# Patient Record
Sex: Female | Born: 1970 | Race: White | Hispanic: No | Marital: Married | Smoking: Current every day smoker
Health system: Southern US, Community
[De-identification: ages and names within clinical notes are randomized; demographics above are authoritative.]

## PROBLEM LIST (undated history)

## (undated) DIAGNOSIS — F32A Depression, unspecified: Secondary | ICD-10-CM

## (undated) DIAGNOSIS — K219 Gastro-esophageal reflux disease without esophagitis: Secondary | ICD-10-CM

## (undated) DIAGNOSIS — F329 Major depressive disorder, single episode, unspecified: Secondary | ICD-10-CM

## (undated) DIAGNOSIS — E785 Hyperlipidemia, unspecified: Secondary | ICD-10-CM

## (undated) HISTORY — PX: CHOLECYSTECTOMY: SHX55

---

## 2003-12-26 ENCOUNTER — Ambulatory Visit: Payer: Self-pay | Admitting: General Surgery

## 2005-02-02 ENCOUNTER — Ambulatory Visit: Payer: Self-pay | Admitting: Pain Medicine

## 2009-12-13 ENCOUNTER — Ambulatory Visit: Payer: Self-pay | Admitting: Internal Medicine

## 2010-10-06 ENCOUNTER — Ambulatory Visit: Payer: Self-pay | Admitting: Internal Medicine

## 2010-10-30 ENCOUNTER — Ambulatory Visit: Payer: Self-pay | Admitting: Family Medicine

## 2011-08-28 ENCOUNTER — Ambulatory Visit: Payer: Self-pay | Admitting: Internal Medicine

## 2011-08-29 ENCOUNTER — Emergency Department: Payer: Self-pay | Admitting: Emergency Medicine

## 2012-08-27 ENCOUNTER — Ambulatory Visit: Payer: Self-pay | Admitting: Family Medicine

## 2012-08-29 ENCOUNTER — Ambulatory Visit: Payer: Self-pay

## 2013-01-29 ENCOUNTER — Ambulatory Visit: Payer: Self-pay | Admitting: Family Medicine

## 2013-03-13 ENCOUNTER — Ambulatory Visit: Payer: Self-pay | Admitting: Physician Assistant

## 2013-03-19 LAB — RAPID INFLUENZA A&B ANTIGENS

## 2013-04-16 ENCOUNTER — Ambulatory Visit: Payer: Self-pay | Admitting: Physician Assistant

## 2013-12-02 ENCOUNTER — Ambulatory Visit: Payer: Self-pay | Admitting: Family Medicine

## 2014-09-01 ENCOUNTER — Encounter: Payer: Self-pay | Admitting: Emergency Medicine

## 2014-09-01 ENCOUNTER — Ambulatory Visit
Admission: EM | Admit: 2014-09-01 | Discharge: 2014-09-01 | Disposition: A | Discharge status: Home / Self Care | Payer: Managed Care, Other (non HMO) | Attending: Family Medicine | Admitting: Family Medicine

## 2014-09-01 DIAGNOSIS — F17210 Nicotine dependence, cigarettes, uncomplicated: Secondary | ICD-10-CM | POA: Insufficient documentation

## 2014-09-01 DIAGNOSIS — B9689 Other specified bacterial agents as the cause of diseases classified elsewhere: Secondary | ICD-10-CM

## 2014-09-01 DIAGNOSIS — N760 Acute vaginitis: Secondary | ICD-10-CM | POA: Diagnosis not present

## 2014-09-01 DIAGNOSIS — N898 Other specified noninflammatory disorders of vagina: Secondary | ICD-10-CM | POA: Diagnosis present

## 2014-09-01 DIAGNOSIS — A499 Bacterial infection, unspecified: Secondary | ICD-10-CM

## 2014-09-01 DIAGNOSIS — E785 Hyperlipidemia, unspecified: Secondary | ICD-10-CM | POA: Insufficient documentation

## 2014-09-01 HISTORY — DX: Hyperlipidemia, unspecified: E78.5

## 2014-09-01 LAB — WET PREP, GENITAL
Trich, Wet Prep: NONE SEEN
Yeast Wet Prep HPF POC: NONE SEEN

## 2014-09-01 MED ORDER — METRONIDAZOLE 0.75 % VA GEL: 1.0000 | Freq: Every day | VAGINAL | Status: DC

## 2014-09-01 NOTE — Discharge Instructions (Signed)
Bacterial Vaginosis Bacterial vaginosis is a vaginal infection that occurs when the normal balance of bacteria in the vagina is disrupted. It results from an overgrowth of certain bacteria. This is the most common vaginal infection in women of childbearing age. Treatment is important to prevent complications, especially in pregnant women, as it can cause a premature delivery. CAUSES  Bacterial vaginosis is caused by an increase in harmful bacteria that are normally present in smaller amounts in the vagina. Several different kinds of bacteria can cause bacterial vaginosis. However, the reason that the condition develops is not fully understood. RISK FACTORS Certain activities or behaviors can put you at an increased risk of developing bacterial vaginosis, including:  Having a new sex partner or multiple sex partners.  Douching.  Using an intrauterine device (IUD) for contraception. Women do not get bacterial vaginosis from toilet seats, bedding, swimming pools, or contact with objects around them. SIGNS AND SYMPTOMS  Some women with bacterial vaginosis have no signs or symptoms. Common symptoms include:  Grey vaginal discharge.  A fishlike odor with discharge, especially after sexual intercourse.  Itching or burning of the vagina and vulva.  Burning or pain with urination. DIAGNOSIS  Your health care provider will take a medical history and examine the vagina for signs of bacterial vaginosis. A sample of vaginal fluid may be taken. Your health care provider will look at this sample under a microscope to check for bacteria and abnormal cells. A vaginal pH test may also be done.  TREATMENT  Bacterial vaginosis may be treated with antibiotic medicines. These may be given in the form of a pill or a vaginal cream. A second round of antibiotics may be prescribed if the condition comes back after treatment.  HOME CARE INSTRUCTIONS   Only take over-the-counter or prescription medicines as  directed by your health care provider.  If antibiotic medicine was prescribed, take it as directed. Make sure you finish it even if you start to feel better.  Do not have sex until treatment is completed.  Tell all sexual partners that you have a vaginal infection. They should see their health care provider and be treated if they have problems, such as a mild rash or itching.  Practice safe sex by using condoms and only having one sex partner. SEEK MEDICAL CARE IF:   Your symptoms are not improving after 3 days of treatment.  You have increased discharge or pain.  You have a fever. MAKE SURE YOU:   Understand these instructions.  Will watch your condition.  Will get help right away if you are not doing well or get worse. FOR MORE INFORMATION  Centers for Disease Control and Prevention, Division of STD Prevention: www.cdc.gov/std American Sexual Health Association (ASHA): www.ashastd.org  Document Released: 02/23/2005 Document Revised: 12/14/2012 Document Reviewed: 10/05/2012 ExitCare Patient Information 2015 [employer]. This information is not intended to replace advice given to you by your health care provider. Make sure you discuss any questions you have with your health care provider.  

## 2014-09-01 NOTE — ED Provider Notes (Signed)
CSN: [id number]     Arrival date & time 09/01/14  1034 History   First MD Initiated Contact with Patient 09/01/14 1059     Chief Complaint  Patient presents with  . Vaginal Discharge   (Consider location/radiation/quality/duration/timing/severity/associated sxs/prior Treatment) HPI Comments: Caucasian female with white discharge since this am.  Took po metronidazole 4 months ago for bacterial vaginosis and symptoms cleared but have reoccurred.  Slight itching/discomfort.  Wears synthetic underwear and currently on menstrual cycle wearing pads/tampons.  Patient is a 44 y.o. female presenting with vaginal discharge. The history is provided by the patient.  Vaginal Discharge Quality:  White and thin Severity:  Moderate Onset quality:  Sudden Timing:  Constant Progression:  Worsening Chronicity:  Recurrent Context: after intercourse   Context: not after urination, not at rest, not during bowel movement, not during intercourse, not during pregnancy, not during urination, not genital trauma, not recent antibiotic use and not spontaneously   Relieved by:  None tried Worsened by:  Urination Ineffective treatments:  None tried Associated symptoms: vaginal itching   Associated symptoms: no abdominal pain, no dyspareunia, no dysuria, no fever, no genital lesions, no nausea, no rash, no urinary frequency, no urinary hesitancy, no urinary incontinence and no vomiting   Risk factors: no endometriosis, no foreign body, no gynecological surgery, no immunosuppression, no new sexual partner, no PID, no prior miscarriage, no STI, no STI exposure, no terminated pregnancy and no unprotected sex     Past Medical History  Diagnosis Date  . Hyperlipidemia    Past Surgical History  Procedure Laterality Date  . Cholecystectomy     No family history on file. History  Substance Use Topics  . Smoking status: Current Every Day Smoker -- 1.00 packs/day for 2 years    Types: Cigarettes  . Smokeless tobacco:  Not on file  . Alcohol Use: No   OB History    No data available     Review of Systems  Constitutional: Negative for fever, chills, diaphoresis, activity change, appetite change and fatigue.  HENT: Negative for facial swelling, mouth sores and trouble swallowing.   Eyes: Negative for photophobia, pain, discharge, redness, itching and visual disturbance.  Respiratory: Negative for cough and wheezing.   Cardiovascular: Negative for chest pain, palpitations and leg swelling.  Gastrointestinal: Negative for nausea, vomiting, abdominal pain, diarrhea, constipation, blood in stool, abdominal distention, anal bleeding and rectal pain.  Endocrine: Negative for cold intolerance and heat intolerance.  Genitourinary: Positive for vaginal bleeding, vaginal discharge and vaginal pain. Negative for bladder incontinence, dysuria, hesitancy, urgency, frequency, hematuria, flank pain, decreased urine volume, enuresis, genital sores, menstrual problem, pelvic pain and dyspareunia.  Musculoskeletal: Negative for myalgias, back pain, joint swelling, arthralgias, gait problem, neck pain and neck stiffness.  Skin: Negative for color change, pallor, rash and wound.  Allergic/Immunologic: Negative for environmental allergies, food allergies and immunocompromised state.  Neurological: Negative for dizziness, tremors, facial asymmetry, weakness, light-headedness and headaches.  Hematological: Negative for adenopathy. Does not bruise/bleed easily.  Psychiatric/Behavioral: Negative for behavioral problems, confusion, sleep disturbance and agitation.    Allergies  Review of patient's allergies indicates no known allergies.  Home Medications   Prior to Admission medications   Medication Sig Start Date End Date Taking? Authorizing Provider  atorvastatin (LIPITOR) 40 MG tablet Take 40 mg by mouth daily.   Yes Historical Provider, MD  metroNIDAZOLE (METROGEL) 0.75 % vaginal gel Place 1 Applicatorful vaginally at  bedtime. For 5 days 09/01/14   Barbaraann Barthel, NP  BP 141/79 mmHg  Pulse 81  Temp(Src) 97.7 F (36.5 C) (Tympanic)  Resp 18  Ht 5' (1.524 m)  Wt 162 lb (73.483 kg)  BMI 31.64 kg/m2  SpO2 100%  LMP 09/01/2014 (Approximate) Physical Exam  Constitutional: She is oriented to person, place, and time. Vital signs are normal. She appears well-developed and well-nourished.  HENT:  Head: Normocephalic and atraumatic.  Right Ear: External ear normal.  Left Ear: External ear normal.  Nose: Nose normal.  Mouth/Throat: Oropharynx is clear and moist. No oropharyngeal exudate.  Eyes: Conjunctivae, EOM and lids are normal. Pupils are equal, round, and reactive to light.  Neck: Trachea normal and normal range of motion. Neck supple. No tracheal deviation present.  Cardiovascular: Normal rate, regular rhythm, normal heart sounds and intact distal pulses.   Pulmonary/Chest: Effort normal and breath sounds normal. No stridor. No respiratory distress. She has no wheezes.  Abdominal: Soft. Bowel sounds are normal. She exhibits no distension. There is no tenderness. Hernia confirmed negative in the right inguinal area and confirmed negative in the left inguinal area.  Genitourinary: Uterus normal. No labial fusion. There is no rash, tenderness, lesion or injury on the right labia. There is no rash, tenderness, lesion or injury on the left labia. No erythema, tenderness or bleeding in the vagina. No foreign body around the vagina. No signs of injury around the vagina. Vaginal discharge found.  Thin grey discharge slight pink tinge mucous; chaperoned by Kreg Shropshire, RN vaginal ruggae present no erythema no foul odor no cervical motion tenderness  Musculoskeletal: Normal range of motion.  Lymphadenopathy:    She has no cervical adenopathy.       Right: No inguinal adenopathy present.       Left: No inguinal adenopathy present.  Neurological: She is alert and oriented to person, place, and time.  Coordination normal.  Skin: Skin is warm, dry and intact. No rash noted. No erythema. No pallor.  Psychiatric: She has a normal mood and affect. Her speech is normal and behavior is normal. Judgment and thought content normal. Cognition and memory are normal.  Nursing note and vitals reviewed.   ED Course  Procedures (including critical care time) Labs Review Labs Reviewed  WET PREP, GENITAL - Abnormal; Notable for the following:    Clue Cells Wet Prep HPF POC FEW (*)    WBC, Wet Prep HPF POC FEW (*)    All other components within normal limits    Imaging Review No results found.   MDM   1. Bacterial vaginosis    metrogel vaginal 1 applicatorful at bedtime for 5 days.   Wet prep positive clue cells discussed with patient and given copy of lab report.  Patient to take metronidazole as directed avoid alcohol intake during entire therapy period.  Exitcare handout on bacterial vaginosis given to patient.  RTC if worsening symptoms or new symptoms.  Discussed not continuously wearing pantiliners, cotton crotch underwear, yogurt with active cultures as recurrent infection in past 6 months.  Patient verbalized understanding and agreed with plan of care and had no further questions at this time.  Patient has taken metronidazole in past year without side effects    Barbaraann Barthel, NP 09/01/14 1214

## 2014-09-01 NOTE — ED Notes (Signed)
Patient presents here with c/o while cheezy , foul smelling vaginal discharge since this am, associted with mild itching, denies any fever, pt states that she had h/o bacterial vaginosis in the past and the symptoms now are the same,

## 2014-09-28 ENCOUNTER — Other Ambulatory Visit: Payer: Self-pay | Admitting: Family Medicine

## 2014-09-28 DIAGNOSIS — N938 Other specified abnormal uterine and vaginal bleeding: Secondary | ICD-10-CM

## 2014-10-02 ENCOUNTER — Ambulatory Visit
Admission: RE | Admit: 2014-10-02 | Discharge: 2014-10-02 | Disposition: A | Discharge status: Home / Self Care | Payer: Managed Care, Other (non HMO) | Source: Ambulatory Visit | Attending: Family Medicine | Admitting: Family Medicine

## 2014-10-02 DIAGNOSIS — N938 Other specified abnormal uterine and vaginal bleeding: Secondary | ICD-10-CM | POA: Diagnosis present

## 2015-11-15 NOTE — H&P (Signed)
HPI: AUB since June with significant pelvic pain with positional dyspareunia with 2 prior C/S and an anterior adhesion of the uterus to the wall. Workup for AUB by [facility] found submucosal polyp  Workup:  Pap: 7/16- Normal with neg HPV EMBx: Neg for malignancy TVUS:  Saline us Endometrial mass seen=0.83 x 0.56 x 0.59 cm  Ut wnl  Rt ov not seen Lt ov seen with two simple cysts 1=2.22 cm 2=1.46 cm    Past Medical History:  has a past medical history of Abnormal uterine bleeding.  Past Surgical History:  has a past surgical history that includes Cholecystectomy and Tubal ligation. Family History: family history includes Hypertension in her mother. Social History:  reports that she has been smoking Cigarettes.  She has a 10.00 pack-year smoking history. She has never used smokeless tobacco. She reports that she does not drink alcohol or use illicit drugs. OB/GYN History:  OB History    Gravida Para Term Preterm AB Living   3 2 1   3    SAB TAB Ectopic Multiple Live Births      1       Allergies: has No Known Allergies. Medications:  Current Outpatient Prescriptions:  .  atorvastatin (LIPITOR) 40 MG tablet, Take 1 tablet (40 mg total) by mouth once daily., Disp: 90 tablet, Rfl: 3 .  citalopram (CELEXA) 10 MG tablet, Take 1 tablet (10 mg total) by mouth nightly., Disp: 30 tablet, Rfl: 11 .  PROAIR HFA 90 mcg/actuation inhaler, INHALE 2 INHALATIONS INTO THE LUNGS EVERY 4 (FOUR) HOURS AS NEEDED FOR WHEEZING., Disp: , Rfl: 99   Review of Systems: No SOB, no palpitations or chest pain, no new lower extremity edema, no nausea or vomiting or bowel or bladder complaints. See HPI for gyn specific ROS.   Exam:     BP 116/79  Pulse 86  Wt 71.7 kg (158 lb)  BMI 30.86 kg/m2  General: Patient is well-groomed, well-nourished, appears stated age in no acute distress  HEENT: head is atraumatic and normocephalic, trachea is midline, neck is supple with no palpable  nodules  CV: Regular rhythm and normal heart rate, no murmur  Pulm: Clear to auscultation throughout lung fields with no wheezing, crackles, or rhonchi. No increased work of breathing  Abdomen: soft , no mass, non-tender, no rebound tenderness, no hepatomegaly  Pelvic: tanner stage 5 ,                        External genitalia: vulva /labia no lesions                       Urethra: no prolapse                       Vagina: normal physiologic d/c, laxity in vaginal walls                       Cervix: no lesions, no cervical motion tenderness, good descent                       Uterus: normal size shape and contour, non-tender                       Adnexa: no mass,  non-tender  Rectovaginal: External wnl   Impression:   The primary encounter diagnosis was Abnormal uterine bleeding (AUB). Diagnoses of Pelvic pain in female and Tobacco use disorder were also pertinent to this visit.    Plan:    Patient returns for a preoperative discussion regarding her plans to proceed with surgical treatment of her  by total laparoscopic hysterectomy with bilateral salpingectomy  procedure. We will likely perform a cystoscopy to evaluate the urinary tract after the procedure.   Because of the sharp anterior angle of her uterus and likely scarring from prior C/S, an open hysterectomy is possible and was consented for today.  The patient and I discussed the technical aspects of the procedure including the potential for risks and complications. These include but are not limited to the risk of infection requiring post-operative antibiotics or further procedures. We talked about the risk of injury to adjacent organs including bladder, bowel, ureter, blood vessels or nerves. We talked about the need to convert to an open incision. We talked about the possible need for blood transfusion. We talked aboutpostop complications such asthromboembolic or cardiopulmonary  complications. All of her questions were answered.  Her preoperative exam was completed and the appropriate consents were signed. She is scheduled to undergo this procedure in the near future.  Specific Peri-operative Considerations:  - Consent: obtained today - Health Maintenance: up to date- smoking cessation recommended - Labs: CBC, CMP preoperatively - Studies: EKG, CXR preoperatively - Bowel Preparation: None required - Abx:  Cefoxitin 2g - VTE ppx: SCDs perioperatively - Glucose Protocol: n/a - Beta-blockade: n/a

## 2015-11-19 NOTE — Patient Instructions (Signed)
  Your procedure is scheduled on: 11-25-15 [facility](MONDAY) Report to Same Day Surgery 2nd floor medical mall To find out your arrival time please call [phone] between 1PM - 3PM on  11-22-15 (FRIDAY)  Remember: Instructions that are not followed completely may result in serious medical risk, up to and including death, or upon the discretion of your surgeon and anesthesiologist your surgery may need to be rescheduled.    _x___ 1. Do not eat food or drink liquids after midnight. No gum chewing or hard candies.     __x__ 2. No Alcohol for 24 hours before or after surgery.   __x__3. No Smoking for 24 prior to surgery.   ____  4. Bring all medications with you on the day of surgery if instructed.    __x__ 5. Notify your doctor if there is any change in your medical condition     (cold, fever, infections).     Do not wear jewelry, make-up, hairpins, clips or nail polish.  Do not wear lotions, powders, or perfumes. You may wear deodorant.  Do not shave 48 hours prior to surgery. Men may shave face and neck.  Do not bring valuables to the hospital.    [facility] any belongings or valuables.               Contacts, dentures or bridgework may not be worn into surgery.  Leave your suitcase in the car. After surgery it may be brought to your room.  For patients admitted to the hospital, discharge time is determined by your treatment team.   Patients discharged the day of surgery will not be allowed to drive home.    Please read over the following fact sheets that you were given:   [facility] Preparing for [facility] and or MRSA Information   ____ Take these medicines the morning of surgery with A SIP OF WATER:    1. NONE  2.  3.  4.  5.  6.  ____ Fleet Enema (as directed)   _x___ Use CHG Soap or sage wipes as directed on instruction sheet   ____ Use inhalers on the day of surgery and bring to hospital day of surgery  ____ Stop metformin 2 days prior to  surgery    ____ Take 1/2 of usual insulin dose the night before surgery and none on the morning of  surgery.   ____ Stop aspirin or coumadin, or plavix  _x__ Stop Anti-inflammatories such as Advil, Aleve, Ibuprofen, Motrin, Naproxen,          Naprosyn, Goodies powders or aspirin products. Ok to take Tylenol.   _X___ Stop supplements until after surgery-STOP PHENTERMINE NOW    ____ Bring C-Pap to the hospital.

## 2015-11-20 ENCOUNTER — Encounter
Admission: RE | Admit: 2015-11-20 | Discharge: 2015-11-20 | Disposition: A | Discharge status: Home / Self Care | Payer: Managed Care, Other (non HMO) | Source: Ambulatory Visit | Attending: Obstetrics and Gynecology | Admitting: Obstetrics and Gynecology

## 2015-11-20 DIAGNOSIS — Z01818 Encounter for other preprocedural examination: Secondary | ICD-10-CM | POA: Diagnosis not present

## 2015-11-20 HISTORY — DX: Major depressive disorder, single episode, unspecified: F32.9

## 2015-11-20 HISTORY — DX: Gastro-esophageal reflux disease without esophagitis: K21.9

## 2015-11-20 HISTORY — DX: Depression, unspecified: F32.A

## 2015-11-20 LAB — CBC
HCT: 41 % (ref 35.0–47.0)
Hemoglobin: 13.8 g/dL (ref 12.0–16.0)
MCH: 29.7 pg (ref 26.0–34.0)
MCHC: 33.6 g/dL (ref 32.0–36.0)
MCV: 88.4 fL (ref 80.0–100.0)
Platelets: 290 10*3/uL (ref 150–440)
RBC: 4.64 MIL/uL (ref 3.80–5.20)
RDW: 14 % (ref 11.5–14.5)
WBC: 12.2 10*3/uL — ABNORMAL HIGH (ref 3.6–11.0)

## 2015-11-20 LAB — BASIC METABOLIC PANEL
Anion gap: 8 (ref 5–15)
BUN: 12 mg/dL (ref 6–20)
CO2: 27 mmol/L (ref 22–32)
Calcium: 9.4 mg/dL (ref 8.9–10.3)
Chloride: 105 mmol/L (ref 101–111)
Creatinine, Ser: 0.75 mg/dL (ref 0.44–1.00)
GFR calc Af Amer: >60 mL/min (ref >60)
GFR calc non Af Amer: >60 mL/min (ref >60)
Glucose, Bld: 186 mg/dL — ABNORMAL HIGH (ref 65–99)
Potassium: 3.9 mmol/L (ref 3.5–5.1)
Sodium: 140 mmol/L (ref 135–145)

## 2015-11-20 LAB — TYPE AND SCREEN
ABO/RH(D): A POS
Antibody Screen: NEGATIVE

## 2015-11-25 ENCOUNTER — Ambulatory Visit: Payer: Managed Care, Other (non HMO) | Admitting: Anesthesiology

## 2015-11-25 ENCOUNTER — Encounter
Admission: AD | Disposition: A | Discharge status: Home / Self Care | Payer: Self-pay | Source: Ambulatory Visit | Attending: Obstetrics and Gynecology

## 2015-11-25 ENCOUNTER — Inpatient Hospital Stay
Admission: AD | Admit: 2015-11-25 | Discharge: 2015-11-28 | DRG: 743 | Disposition: A | Discharge status: Home / Self Care | Payer: Managed Care, Other (non HMO) | Source: Ambulatory Visit | Attending: Obstetrics and Gynecology | Admitting: Obstetrics and Gynecology

## 2015-11-25 DIAGNOSIS — F17210 Nicotine dependence, cigarettes, uncomplicated: Secondary | ICD-10-CM | POA: Diagnosis present

## 2015-11-25 DIAGNOSIS — Z23 Encounter for immunization: Secondary | ICD-10-CM | POA: Diagnosis not present

## 2015-11-25 DIAGNOSIS — D72829 Elevated white blood cell count, unspecified: Secondary | ICD-10-CM | POA: Diagnosis present

## 2015-11-25 DIAGNOSIS — N9419 Other specified dyspareunia: Secondary | ICD-10-CM | POA: Diagnosis present

## 2015-11-25 DIAGNOSIS — N939 Abnormal uterine and vaginal bleeding, unspecified: Principal | ICD-10-CM | POA: Diagnosis present

## 2015-11-25 DIAGNOSIS — Z8249 Family history of ischemic heart disease and other diseases of the circulatory system: Secondary | ICD-10-CM

## 2015-11-25 DIAGNOSIS — Z5331 Laparoscopic surgical procedure converted to open procedure: Secondary | ICD-10-CM

## 2015-11-25 DIAGNOSIS — I10 Essential (primary) hypertension: Secondary | ICD-10-CM | POA: Diagnosis present

## 2015-11-25 DIAGNOSIS — K219 Gastro-esophageal reflux disease without esophagitis: Secondary | ICD-10-CM | POA: Diagnosis present

## 2015-11-25 HISTORY — PX: HYSTERECTOMY ABDOMINAL WITH SALPINGECTOMY: SHX6725

## 2015-11-25 HISTORY — PX: CYSTOSCOPY: SHX5120

## 2015-11-25 HISTORY — PX: LAPAROSCOPIC HYSTERECTOMY: SHX1926

## 2015-11-25 LAB — TYPE AND SCREEN
ABO/RH(D): A POS
ANTIBODY SCREEN: NEGATIVE

## 2015-11-25 LAB — POCT PREGNANCY, URINE: Preg Test, Ur: NEGATIVE

## 2015-11-25 SURGERY — EXAM UNDER ANESTHESIA
Anesthesia: General

## 2015-11-25 MED ORDER — FLUORESCEIN SODIUM 1 MG OP STRP
ORAL_STRIP | OPHTHALMIC | Status: AC
  Filled 2015-11-25: qty 3

## 2015-11-25 MED ORDER — CEFAZOLIN SODIUM-DEXTROSE 2-4 GM/100ML-% IV SOLN
INTRAVENOUS | Status: AC
  Administered 2015-11-25: 2 g via INTRAVENOUS
  Filled 2015-11-25: qty 100

## 2015-11-25 MED ORDER — HYDROMORPHONE 1 MG/ML IV SOLN
INTRAVENOUS | Status: DC
  Administered 2015-11-25: 4.41 mg via INTRAVENOUS
  Administered 2015-11-26: 5.39 mg via INTRAVENOUS
  Administered 2015-11-26: 4.6 mg via INTRAVENOUS
  Administered 2015-11-26: 3.68 mg via INTRAVENOUS
  Administered 2015-11-26: 07:00:00 via INTRAVENOUS
  Filled 2015-11-25: qty 25

## 2015-11-25 MED ORDER — METOCLOPRAMIDE HCL 5 MG/ML IJ SOLN: 10.0000 mg | Freq: Once | INTRAMUSCULAR | Status: DC

## 2015-11-25 MED ORDER — HYDROMORPHONE 1 MG/ML IV SOLN
INTRAVENOUS | Status: DC
  Administered 2015-11-25: 13:00:00 via INTRAVENOUS
  Filled 2015-11-25: qty 25

## 2015-11-25 MED ORDER — DIPHENHYDRAMINE HCL 50 MG/ML IJ SOLN: 12.5000 mg | Freq: Four times a day (QID) | INTRAMUSCULAR | Status: DC | PRN

## 2015-11-25 MED ORDER — CEFAZOLIN SODIUM-DEXTROSE 2-4 GM/100ML-% IV SOLN
2.0000 g | INTRAVENOUS | Status: AC
  Administered 2015-11-25: 2 g via INTRAVENOUS

## 2015-11-25 MED ORDER — HYDROMORPHONE HCL 1 MG/ML IJ SOLN
INTRAMUSCULAR | Status: AC
  Administered 2015-11-25: 0.5 mg via INTRAVENOUS
  Filled 2015-11-25: qty 1

## 2015-11-25 MED ORDER — FAMOTIDINE 20 MG PO TABS
ORAL_TABLET | ORAL | Status: AC
  Administered 2015-11-25: 20 mg via ORAL
  Filled 2015-11-25: qty 1

## 2015-11-25 MED ORDER — ONDANSETRON HCL 4 MG/2ML IJ SOLN: 4.0000 mg | Freq: Once | INTRAMUSCULAR | Status: DC | PRN

## 2015-11-25 MED ORDER — BUPIVACAINE HCL 0.5 % IJ SOLN
INTRAMUSCULAR | Status: DC | PRN
  Administered 2015-11-25: 10 mL

## 2015-11-25 MED ORDER — FAMOTIDINE 20 MG PO TABS
20.0000 mg | ORAL_TABLET | Freq: Once | ORAL | Status: AC
  Administered 2015-11-25: 20 mg via ORAL

## 2015-11-25 MED ORDER — PROPOFOL 10 MG/ML IV BOLUS
INTRAVENOUS | Status: DC | PRN
  Administered 2015-11-25: 160 mg via INTRAVENOUS

## 2015-11-25 MED ORDER — METHYLENE BLUE 0.5 % INJ SOLN
INTRAVENOUS | Status: AC
  Filled 2015-11-25: qty 10

## 2015-11-25 MED ORDER — BUPIVACAINE HCL (PF) 0.5 % IJ SOLN
INTRAMUSCULAR | Status: AC
  Filled 2015-11-25: qty 30

## 2015-11-25 MED ORDER — ACETAMINOPHEN 10 MG/ML IV SOLN
INTRAVENOUS | Status: DC | PRN
  Administered 2015-11-25: 1000 mg via INTRAVENOUS

## 2015-11-25 MED ORDER — NALOXONE HCL 0.4 MG/ML IJ SOLN: 0.4000 mg | INTRAMUSCULAR | Status: DC | PRN

## 2015-11-25 MED ORDER — LACTATED RINGERS IV SOLN
INTRAVENOUS | Status: DC
  Administered 2015-11-25 – 2015-11-26 (×4): via INTRAVENOUS

## 2015-11-25 MED ORDER — MENTHOL 3 MG MT LOZG
1.0000 | LOZENGE | OROMUCOSAL | Status: DC | PRN
  Filled 2015-11-25: qty 9

## 2015-11-25 MED ORDER — ROCURONIUM BROMIDE 100 MG/10ML IV SOLN
INTRAVENOUS | Status: DC | PRN
  Administered 2015-11-25: 10 mg via INTRAVENOUS
  Administered 2015-11-25: 30 mg via INTRAVENOUS
  Administered 2015-11-25: 50 mg via INTRAVENOUS
  Administered 2015-11-25: 20 mg via INTRAVENOUS

## 2015-11-25 MED ORDER — OXYCODONE-ACETAMINOPHEN 5-325 MG PO TABS
1.0000 | ORAL_TABLET | ORAL | Status: DC | PRN
  Administered 2015-11-26 – 2015-11-28 (×12): 2 via ORAL
  Filled 2015-11-25 (×12): qty 2

## 2015-11-25 MED ORDER — IBUPROFEN 600 MG PO TABS
600.0000 mg | ORAL_TABLET | Freq: Four times a day (QID) | ORAL | Status: DC | PRN
  Administered 2015-11-25 – 2015-11-28 (×8): 600 mg via ORAL
  Filled 2015-11-25 (×8): qty 1

## 2015-11-25 MED ORDER — LIDOCAINE HCL (CARDIAC) 20 MG/ML IV SOLN
INTRAVENOUS | Status: DC | PRN
  Administered 2015-11-25: 100 mg via INTRAVENOUS

## 2015-11-25 MED ORDER — MIDAZOLAM HCL 2 MG/2ML IJ SOLN
INTRAMUSCULAR | Status: DC | PRN
  Administered 2015-11-25: 2 mg via INTRAVENOUS

## 2015-11-25 MED ORDER — HYDROMORPHONE HCL 1 MG/ML IJ SOLN
0.2500 mg | INTRAMUSCULAR | Status: DC | PRN
  Administered 2015-11-25 (×4): 0.5 mg via INTRAVENOUS

## 2015-11-25 MED ORDER — ATORVASTATIN CALCIUM 10 MG PO TABS
40.0000 mg | ORAL_TABLET | Freq: Every day | ORAL | Status: DC
  Administered 2015-11-25 – 2015-11-27 (×3): 40 mg via ORAL
  Filled 2015-11-25 (×3): qty 4

## 2015-11-25 MED ORDER — FLUORESCEIN SODIUM 10 % IV SOLN
INTRAVENOUS | Status: AC
  Filled 2015-11-25: qty 5

## 2015-11-25 MED ORDER — ACETAMINOPHEN 10 MG/ML IV SOLN
INTRAVENOUS | Status: AC
  Filled 2015-11-25: qty 100

## 2015-11-25 MED ORDER — METHYLENE BLUE 0.5 % INJ SOLN
INTRAVENOUS | Status: DC | PRN
  Administered 2015-11-25: 1 mL via SUBMUCOSAL

## 2015-11-25 MED ORDER — FENTANYL CITRATE (PF) 100 MCG/2ML IJ SOLN
INTRAMUSCULAR | Status: AC
  Administered 2015-11-25: 25 ug via INTRAVENOUS
  Filled 2015-11-25: qty 2

## 2015-11-25 MED ORDER — METOCLOPRAMIDE HCL 5 MG/ML IJ SOLN
INTRAMUSCULAR | Status: AC
  Filled 2015-11-25: qty 2

## 2015-11-25 MED ORDER — LACTATED RINGERS IV SOLN: INTRAVENOUS | Status: DC

## 2015-11-25 MED ORDER — KETOROLAC TROMETHAMINE 30 MG/ML IJ SOLN: 30.0000 mg | Freq: Once | INTRAMUSCULAR | Status: DC

## 2015-11-25 MED ORDER — SODIUM CHLORIDE 0.9% FLUSH: 9.0000 mL | INTRAVENOUS | Status: DC | PRN

## 2015-11-25 MED ORDER — KETOROLAC TROMETHAMINE 30 MG/ML IJ SOLN
INTRAMUSCULAR | Status: DC | PRN
  Administered 2015-11-25: 30 mg via INTRAVENOUS

## 2015-11-25 MED ORDER — LACTATED RINGERS IV BOLUS (SEPSIS)
500.0000 mL | Freq: Once | INTRAVENOUS | Status: AC
  Administered 2015-11-25: 500 mL via INTRAVENOUS

## 2015-11-25 MED ORDER — ONDANSETRON HCL 4 MG/2ML IJ SOLN: 4.0000 mg | Freq: Four times a day (QID) | INTRAMUSCULAR | Status: DC | PRN

## 2015-11-25 MED ORDER — LACTATED RINGERS IV SOLN
INTRAVENOUS | Status: DC
  Administered 2015-11-25 (×2): via INTRAVENOUS

## 2015-11-25 MED ORDER — DIPHENHYDRAMINE HCL 12.5 MG/5ML PO ELIX
12.5000 mg | ORAL_SOLUTION | Freq: Four times a day (QID) | ORAL | Status: DC | PRN
  Filled 2015-11-25: qty 5

## 2015-11-25 MED ORDER — SUGAMMADEX SODIUM 200 MG/2ML IV SOLN
INTRAVENOUS | Status: DC | PRN
  Administered 2015-11-25: 147.8 mg via INTRAVENOUS

## 2015-11-25 MED ORDER — FENTANYL CITRATE (PF) 100 MCG/2ML IJ SOLN
INTRAMUSCULAR | Status: DC | PRN
  Administered 2015-11-25 (×8): 50 ug via INTRAVENOUS

## 2015-11-25 MED ORDER — FENTANYL CITRATE (PF) 100 MCG/2ML IJ SOLN
25.0000 ug | INTRAMUSCULAR | Status: AC | PRN
  Administered 2015-11-25 (×6): 25 ug via INTRAVENOUS

## 2015-11-25 MED ORDER — DEXAMETHASONE SODIUM PHOSPHATE 10 MG/ML IJ SOLN
INTRAMUSCULAR | Status: DC | PRN
  Administered 2015-11-25: 10 mg via INTRAVENOUS

## 2015-11-25 MED ORDER — DOCUSATE SODIUM 100 MG PO CAPS
100.0000 mg | ORAL_CAPSULE | Freq: Two times a day (BID) | ORAL | Status: DC
  Administered 2015-11-25 – 2015-11-28 (×6): 100 mg via ORAL
  Filled 2015-11-25 (×6): qty 1

## 2015-11-25 MED ORDER — ONDANSETRON HCL 4 MG/2ML IJ SOLN
INTRAMUSCULAR | Status: DC | PRN
  Administered 2015-11-25 (×2): 4 mg via INTRAVENOUS

## 2015-11-25 SURGICAL SUPPLY — 87 items
BAG URO DRAIN 2000ML W/SPOUT (MISCELLANEOUS) ×3 IMPLANT
BARRIER ADHS 3X4 INTERCEED (GAUZE/BANDAGES/DRESSINGS) ×6 IMPLANT
BLADE SURG SZ11 CARB STEEL (BLADE) ×3 IMPLANT
CANISTER SUCT 1200ML W/VALVE (MISCELLANEOUS) ×3 IMPLANT
CATH FOLEY 2WAY  5CC 16FR (CATHETERS)
CATH ROBINSON RED A/P 16FR (CATHETERS) ×3 IMPLANT
CATH TRAY 16F METER LATEX (MISCELLANEOUS) ×3 IMPLANT
CATH URTH 16FR FL 2W BLN LF (CATHETERS) IMPLANT
CHLORAPREP W/TINT 26ML (MISCELLANEOUS) ×3 IMPLANT
CUP MEDICINE 2OZ PLAST GRAD ST (MISCELLANEOUS) IMPLANT
DEFOGGER SCOPE WARMER CLEARIFY (MISCELLANEOUS) ×3 IMPLANT
DEVICE SUTURE ENDOST 10MM (ENDOMECHANICALS) IMPLANT
DRAPE LAP W/FLUID (DRAPES) ×3 IMPLANT
DRAPE STERI POUCH LG 24X46 STR (DRAPES) ×6 IMPLANT
DRAPE UNDER BUTTOCK W/FLU (DRAPES) ×3 IMPLANT
DRSG OPSITE POSTOP 4X10 (GAUZE/BANDAGES/DRESSINGS) ×3 IMPLANT
DRSG TEGADERM 2-3/8X2-3/4 SM (GAUZE/BANDAGES/DRESSINGS) IMPLANT
DRSG TELFA 3X8 NADH (GAUZE/BANDAGES/DRESSINGS) ×3 IMPLANT
ELECT CAUTERY BLADE 6.4 (BLADE) ×3 IMPLANT
ELECT REM PT RETURN 9FT ADLT (ELECTROSURGICAL) ×3
ELECTRODE REM PT RTRN 9FT ADLT (ELECTROSURGICAL) ×2 IMPLANT
ENDOPOUCH RETRIEVER 10 (MISCELLANEOUS) ×3 IMPLANT
ENDOSTITCH 0 SINGLE 48 (SUTURE) IMPLANT
GAUZE SPONGE 4X4 12PLY STRL (GAUZE/BANDAGES/DRESSINGS) ×3 IMPLANT
GAUZE SPONGE NON-WVN 2X2 STRL (MISCELLANEOUS) IMPLANT
GLOVE BIO SURGEON STRL SZ 6.5 (GLOVE) ×12 IMPLANT
GLOVE INDICATOR 7.0 STRL GRN (GLOVE) ×3 IMPLANT
GOWN STRL REUS W/ TWL LRG LVL3 (GOWN DISPOSABLE) ×6 IMPLANT
GOWN STRL REUS W/ TWL XL LVL3 (GOWN DISPOSABLE) ×2 IMPLANT
GOWN STRL REUS W/TWL LRG LVL3 (GOWN DISPOSABLE) ×3
GOWN STRL REUS W/TWL XL LVL3 (GOWN DISPOSABLE) ×1
IRRIGATION STRYKERFLOW (MISCELLANEOUS) ×2 IMPLANT
IRRIGATOR STRYKERFLOW (MISCELLANEOUS) ×3
IV LACTATED RINGERS 1000ML (IV SOLUTION) ×3 IMPLANT
IV NS 1000ML (IV SOLUTION) ×1
IV NS 1000ML BAXH (IV SOLUTION) ×2 IMPLANT
KIT RM TURNOVER CYSTO AR (KITS) ×3 IMPLANT
LABEL OR SOLS (LABEL) ×3 IMPLANT
LIGASURE 5MM LAPAROSCOPIC (INSTRUMENTS) ×3 IMPLANT
LIQUID BAND (GAUZE/BANDAGES/DRESSINGS) IMPLANT
MANIPULATOR VCARE LG CRV RETR (MISCELLANEOUS) IMPLANT
MANIPULATOR VCARE SML CRV RETR (MISCELLANEOUS) ×3 IMPLANT
MANIPULATOR VCARE STD CRV RETR (MISCELLANEOUS) IMPLANT
NEEDLE VERESS 14GA 120MM (NEEDLE) ×3 IMPLANT
NS IRRIG 500ML POUR BTL (IV SOLUTION) ×3 IMPLANT
OCCLUDER COLPOPNEUMO (BALLOONS) ×3 IMPLANT
PACK BASIN MAJOR ARMC (MISCELLANEOUS) ×3 IMPLANT
PACK DNC HYST (MISCELLANEOUS) IMPLANT
PACK GYN LAPAROSCOPIC (MISCELLANEOUS) ×3 IMPLANT
PAD OB MATERNITY 4.3X12.25 (PERSONAL CARE ITEMS) ×3 IMPLANT
PAD PREP 24X41 OB/GYN DISP (PERSONAL CARE ITEMS) IMPLANT
SCISSORS METZENBAUM CVD 33 (INSTRUMENTS) ×3 IMPLANT
SET CYSTO W/LG BORE CLAMP LF (SET/KITS/TRAYS/PACK) ×3 IMPLANT
SHEARS HARMONIC ACE PLUS 36CM (ENDOMECHANICALS) IMPLANT
SLEEVE ENDOPATH XCEL 5M (ENDOMECHANICALS) ×9 IMPLANT
SOL PREP PVP 2OZ (MISCELLANEOUS) ×3
SOLUTION PREP PVP 2OZ (MISCELLANEOUS) ×2 IMPLANT
SPONGE LAP 18X18 5 PK (GAUZE/BANDAGES/DRESSINGS) ×3 IMPLANT
SPONGE VERSALON 2X2 STRL (MISCELLANEOUS)
SPONGE XRAY 4X4 16PLY STRL (MISCELLANEOUS) ×3 IMPLANT
STAPLER SKIN PROX 35W (STAPLE) ×3 IMPLANT
STRIP CLOSURE SKIN 1/4X4 (GAUZE/BANDAGES/DRESSINGS) ×3 IMPLANT
SURGILUBE 2OZ TUBE FLIPTOP (MISCELLANEOUS) IMPLANT
SUT MNCRL AB 4-0 PS2 18 (SUTURE) ×3 IMPLANT
SUT PDS AB 1 TP1 96 (SUTURE) ×3 IMPLANT
SUT VIC AB 0 CT1 27 (SUTURE) ×3
SUT VIC AB 0 CT1 27XCR 8 STRN (SUTURE) ×6 IMPLANT
SUT VIC AB 0 CT1 36 (SUTURE) IMPLANT
SUT VIC AB 2-0 SH 27 (SUTURE) ×2
SUT VIC AB 2-0 SH 27XBRD (SUTURE) ×4 IMPLANT
SUT VIC AB 2-0 UR6 27 (SUTURE) IMPLANT
SUT VIC AB 4-0 SH 27 (SUTURE) ×1
SUT VIC AB 4-0 SH 27XANBCTRL (SUTURE) ×2 IMPLANT
SUT VICRYL PLUS ABS 0 54 (SUTURE) IMPLANT
SWABSTK COMLB BENZOIN TINCTURE (MISCELLANEOUS) IMPLANT
SYR 50ML LL SCALE MARK (SYRINGE) ×3 IMPLANT
SYR BULB IRRIG 60ML STRL (SYRINGE) ×3 IMPLANT
SYRINGE 10CC LL (SYRINGE) ×3 IMPLANT
SYRINGE IRR TOOMEY STRL 70CC (SYRINGE) ×3 IMPLANT
TOWEL OR 17X26 4PK STRL BLUE (TOWEL DISPOSABLE) ×3 IMPLANT
TRAY PREP VAG/GEN (MISCELLANEOUS) IMPLANT
TROCAR ENDO BLADELESS 11MM (ENDOMECHANICALS) IMPLANT
TROCAR XCEL NON-BLD 5MMX100MML (ENDOMECHANICALS) ×3 IMPLANT
TROCAR XCEL UNIV SLVE 11M 100M (ENDOMECHANICALS) IMPLANT
TUBING INSUFFLATOR HEATED (MISCELLANEOUS) ×3 IMPLANT
TUBING INSUFFLATOR HI FLOW (MISCELLANEOUS) ×3 IMPLANT
WATER STERILE IRR 1000ML POUR (IV SOLUTION) ×3 IMPLANT

## 2015-11-25 NOTE — Anesthesia Preprocedure Evaluation (Signed)
Anesthesia Evaluation  Patient identified by MRN, date of birth, ID band Patient awake    Reviewed: Allergy & Precautions, NPO status , Patient's Chart, lab work & pertinent test results  History of Anesthesia Complications Negative for: history of anesthetic complications  Airway Mallampati: II       Dental   Pulmonary neg pulmonary ROS, Current Smoker,           Cardiovascular negative cardio ROS       Neuro/Psych Depression negative neurological ROS     GI/Hepatic Neg liver ROS, GERD  Medicated and Controlled,  Endo/Other  negative endocrine ROS  Renal/GU negative Renal ROS     Musculoskeletal   Abdominal   Peds  Hematology negative hematology ROS (+)   Anesthesia Other Findings   Reproductive/Obstetrics                             Anesthesia Physical Anesthesia Plan  ASA: II  Anesthesia Plan: General   Post-op Pain Management:    Induction: Intravenous  Airway Management Planned: Oral ETT  Additional Equipment:   Intra-op Plan:   Post-operative Plan:   Informed Consent: I have reviewed the patients History and Physical, chart, labs and discussed the procedure including the risks, benefits and alternatives for the proposed anesthesia with the patient or authorized representative who has indicated his/her understanding and acceptance.     Plan Discussed with:   Anesthesia Plan Comments:         Anesthesia Quick Evaluation

## 2015-11-25 NOTE — Interval H&P Note (Signed)
History and Physical Interval Note:  11/25/2015 7:29 AM  Heather Francis  has presented today for surgery, with the diagnosis of Pelvic Pain  AUB  The various methods of treatment have been discussed with the patient and family. After consideration of risks, benefits and other options for treatment, the patient has consented to  Procedure(s): EXAM UNDER ANESTHESIA (N/A) HYSTERECTOMY TOTAL LAPAROSCOPIC (N/A) LAPAROSCOPIC BILATERAL SALPINGECTOMY (Bilateral) CYSTOSCOPY (N/A),  Possible HYSTERECTOMY ABDOMINAL WITH SALPINGECTOMY (N/A) as a surgical intervention .  The patient's history has been reviewed, patient examined, no change in status, stable for surgery.  I have reviewed the patient's chart and labs.  Questions were answered to the patient's satisfaction.     Christeen DouglasBEASLEY, Beauford Lando

## 2015-11-25 NOTE — Op Note (Signed)
Heather Francis PROCEDURE DATE: 11/25/2015  PREOPERATIVE DIAGNOSIS:  Abnormal uterine bleeding; Positional dyspareunia POSTOPERATIVE DIAGNOSIS:  Abnormal uterine bleeding; Positional dyspareunia; extensive adhesive disease SURGEON:   Christeen DouglasBethany Shaunta Oncale, MD. ASSISTANT: Jennell Cornerhomas Schermerhorn, M.D. ANESTHESIOLOGIST: Naomie DeanWilliam K Kephart, MD Anesthesiologist: Naomie DeanWilliam K Kephart, MD CRNA: Michaele OfferKasey Savage, CRNA; Junious SilkMark Noles, CRNA; Casey Burkitthuy Hoang, CRNA  OPERATION:  Diagnostic laparoscopy, conversion to Total abdominal hysterectomy, Bilateral Salpingectomy, diagnostic cystoscopy Left oophorectomy  ANESTHESIA:  General endotracheal.  INDICATIONS: The patient is a 45 y.o.. with the aforementioned diagnoses who desires definitive surgical management. Because of her palpable adhesive disease, she was given the 50% chance of having to do an open procedure. This did occur, and she was consented prior to the start of the case for this event.   On the preoperative visit, the risks, benefits, indications, and alternatives of the procedure were reviewed with the patient.  On the day of surgery, the risks of surgery were again discussed with the patient including but not limited to: bleeding which may require transfusion or reoperation; infection which may require antibiotics; injury to bowel, bladder, ureters or other surrounding organs; need for additional procedures; thromboembolic phenomenon, incisional problems and other postoperative/anesthesia complications. Written informed consent was obtained.    OPERATIVE FINDINGS: A 6 week size uterus with normal portions of tubes and ovaries bilaterally. Significant left-sided adhesive disease, significant anterior adhesive disease. The uterus was prominently adherent to the anterior abdominal wall. The left sigmoid was adherent to the left pelvis through filmy adhesions. The left round ligament left tube and left ovary were adherent to each other in a single lump.  ESTIMATED BLOOD LOSS:  300 ml FLUIDS:  1600 ml of Lactated Ringers URINE OUTPUT:  300 ml of clear yellow urine. SPECIMENS:  Uterus,cervix,  bilateral fallopian tubes (and left ovary) sent to pathology COMPLICATIONS:  None immediate.   DESCRIPTION OF PROCEDURE:  The patient received intravenous antibiotics and had sequential compression devices applied to her lower extremities while in the preoperative area.   She was taken to the operating room and placed under general anesthesia without difficulty.prophylactic antibiotics were given. The abdomen and perineum were prepped and draped in a sterile manner, and she was placed in a dorsal supine position.  A Foley catheter was inserted into the bladder and attached to constant drainage.  After an adequate timeout was performed, a 5 mm incision was made in the umbilicus. A 5 mm port was placed under direct visualization with the Optiview trocar. The pelvic findings of adhesive disease as above made it clear that this case with the dangerous to proceed with laparoscopically, and the decision was made to convert to an open procedure. Pictures were taken of the adhesions.   A Pfannensteil skin incision was made. This incision was taken down to the fascia using electrocautery with care given to maintain good hemostasis. The fascia was incised in the midline and the fascial incision was then extended bilaterally using electrocautery without difficulty. The fascia was then dissected off the underlying rectus muscles using blunt and sharp dissection. The rectus muscles were split bluntly in the midline and the peritoneum was hidden behind a thick layer of omentum. The omentum was transected hemostatically with the LigaSure device. The peritoneum was entered sharply without complication. This peritoneal incision was then extended superiorly and inferiorly with care given to prevent bowel or bladder injury. Attention was then turned to the pelvis. A retractor was placed into the incision, and  the bowel was packed away with moist laparotomy sponges.  The uterus at this point was noted to be significantly adherent to the abdominal wall. These adhesions were taken down using a mixture of sharp, blunt, and electrocautery to restore normal anatomy. The left side of the uterus needed to be mobilized and the uterus was delivered up out of the abdomen.  The round ligaments on each side were clamped, suture ligated with 0 Vicryl, and transected with electrocautery allowing entry into the broad ligament. Of note, all sutures used in this procedure are 0 Vicryl unless otherwise noted.   On the left side, because of the significant adhesive disease, and bleeding from the IP ligament, the decision was made to take the left ovary. On the right side, The anterior and posterior leaves of the broad ligament were separated, and the ureters were inspected to be safely away from the area of dissection bilaterally.  Adnexae were clamped on the patient's right side, cut, and doubly suture ligated. The right ovary remained in place. Kelly clamps were placed on the mesosalpinx of the right fallopian tube, and the fallopian tube was excised.  The pedicle was then secured with a free tie.  A similar process was carried out on the left side, allowing for bilateral salpingectomy.     A bladder flap was then created.  The bladder was then bluntly and sharply dissected off the lower uterine segment and cervix with good hemostasis noted. The uterine arteries were then skeletonized bilaterally and then clamped, cut, and doubly suture ligated with care given to prevent ureteral injury.   The uterosacral ligaments were then clamped, cut, and ligated bilaterally.  Finally, the cardinal ligaments were clamped, cut, and ligated bilaterally.  Acutely curved clamps were placed across the vagina just under the cervix, and the specimen was amputated and sent to pathology. The vaginal cuff angles were closed with Heaney stiches with care  given to incorporate the uterosacral-cardinal ligament pedicles on both sides. The middle of the vaginal cuff was closed with a series of interrupted figure-of-eight sutures with care given to incorporate the anterior pubocervical fascia and the posterior rectovaginal fascia. Because of prior adhesive disease, and because the Cervix was long enough that the posterior cul-de-sac was significantly obliterated, care was taken with closing the vaginal cuff to avoid entry into the posterior cul-de-sac structures. The pelvis was irrigated and hemostasis was reconfirmed at all pedicles and along the pelvic sidewall.  All laparotomy sponges and instruments were removed from the abdomen. 2 pieces of adhesion barrier were placed in the midline below the rectus muscles. The peritoneum was not able to the closed, and the fascia was closed in a running fashion using 0 PDS.Marland Kitchen The subcutaneous layer was reapproximated with 2-0 plain gut. The skin was closed with a 4-0 Vicryl subcuticular stitch.   An uncomplicated cystoscopy was undertaken, using fluoroscopy seen IV to identify the bilateral ureteral openings. Both were found, and noted to be effluxing appropriately. Sponge, lap, needle, and instrument counts were correct times two. The patient was taken to the recovery area awake, extubated and in stable condition.

## 2015-11-25 NOTE — Anesthesia Postprocedure Evaluation (Signed)
Anesthesia Post Note  Patient: Heather Francis  Procedure(s) Performed: Procedure(s) (LRB): EXAM UNDER ANESTHESIA (N/A) HYSTERECTOMY TOTAL LAPAROSCOPIC (N/A) CYSTOSCOPY (N/A) HYSTERECTOMY ABDOMINAL WITH SALPINGECTOMY: LEFT OOPHERECTOMY (N/A)  Patient location during evaluation: PACU Anesthesia Type: General Level of consciousness: awake and alert Pain management: pain level controlled Vital Signs Assessment: post-procedure vital signs reviewed and stable Respiratory status: spontaneous breathing and respiratory function stable Cardiovascular status: stable Anesthetic complications: no    Last Vitals:  Vitals:   11/25/15 1320 11/25/15 1419  BP:  (!) 158/76  Pulse:  87  Resp: (!) 24 18  Temp:  36.4 C    Last Pain:  Vitals:   11/25/15 1419  TempSrc: Oral  PainSc:                  Marten Iles K

## 2015-11-25 NOTE — Progress Notes (Signed)
Day of Surgery Procedure(s) (LRB): EXAM UNDER ANESTHESIA (N/A) HYSTERECTOMY TOTAL LAPAROSCOPIC (N/A) CYSTOSCOPY (N/A) HYSTERECTOMY ABDOMINAL WITH SALPINGECTOMY: LEFT OOPHERECTOMY (N/A)  Subjective: Patient reports incisional pain.    Objective: I have reviewed patient's vital signs, intake and output and medications.  General: alert, cooperative and appears stated age GI: soft, appropriately -tender; bowel sounds normal; no masses,  no organomegaly Extremities: extremities normal, atraumatic, no cyanosis or edema  Assessment: s/p Procedure(s) with comments: EXAM UNDER ANESTHESIA (N/A) HYSTERECTOMY TOTAL LAPAROSCOPIC (N/A) - attempted laprascopic approach however had to convert to an open TAH CYSTOSCOPY (N/A) HYSTERECTOMY ABDOMINAL WITH SALPINGECTOMY: LEFT OOPHERECTOMY (N/A): stable  Plan: Continue foley due to PCA use overnight   NPO Clear liquids  LOS: 0 days    Heather Francis 11/25/2015, 6:21 PM

## 2015-11-25 NOTE — Anesthesia Procedure Notes (Signed)
Procedure Name: Intubation Date/Time: 11/25/2015 7:46 AM Performed by: Michaele OfferSAVAGE, Ily Denno Pre-anesthesia Checklist: Patient identified, Emergency Drugs available, Suction available, Patient being monitored and Timeout performed Patient Re-evaluated:Patient Re-evaluated prior to inductionOxygen Delivery Method: Circle system utilized Preoxygenation: Pre-oxygenation with 100% oxygen Intubation Type: IV induction Ventilation: Mask ventilation without difficulty Laryngoscope Size: Mac and 3 Grade View: Grade I Tube type: Oral Tube size: 7.0 mm Number of attempts: 1 Airway Equipment and Method: Rigid stylet Placement Confirmation: ETT inserted through vocal cords under direct vision,  positive ETCO2 and breath sounds checked- equal and bilateral Secured at: 18 cm Tube secured with: Tape Dental Injury: Teeth and Oropharynx as per pre-operative assessment

## 2015-11-25 NOTE — Transfer of Care (Signed)
Immediate Anesthesia Transfer of Care Note  Patient: Heather Francis  Procedure(s) Performed: Procedure(s) with comments: EXAM UNDER ANESTHESIA (N/A) HYSTERECTOMY TOTAL LAPAROSCOPIC (N/A) - attempted laprascopic approach however had to convert to an open TAH CYSTOSCOPY (N/A) HYSTERECTOMY ABDOMINAL WITH SALPINGECTOMY: LEFT OOPHERECTOMY (N/A)  Patient Location: PACU  Anesthesia Type:General  Level of Consciousness: awake, alert , oriented and patient cooperative  Airway & Oxygen Therapy: Patient Spontanous Breathing and Patient connected to face mask oxygen  Post-op Assessment: Report given to RN, Post -op Vital signs reviewed and stable and Patient moving all extremities X 4  Post vital signs: Reviewed and stable  Last Vitals:  Vitals:   11/25/15 0607 11/25/15 0710  BP: 131/75   Pulse: 66   Resp: 16   Temp: 36.5 C 36.6 C    Last Pain:  Vitals:   11/25/15 0710  TempSrc: Tympanic         Complications: No apparent anesthesia complications

## 2015-11-26 ENCOUNTER — Encounter: Payer: Self-pay | Admitting: Obstetrics and Gynecology

## 2015-11-26 LAB — CBC
HCT: 33.6 % — ABNORMAL LOW (ref 35.0–47.0)
Hemoglobin: 11.4 g/dL — ABNORMAL LOW (ref 12.0–16.0)
MCH: 29.8 pg (ref 26.0–34.0)
MCHC: 33.9 g/dL (ref 32.0–36.0)
MCV: 87.9 fL (ref 80.0–100.0)
PLATELETS: 307 10*3/uL (ref 150–440)
RBC: 3.82 MIL/uL (ref 3.80–5.20)
RDW: 14 % (ref 11.5–14.5)
WBC: 21.8 10*3/uL — AB (ref 3.6–11.0)

## 2015-11-26 LAB — BASIC METABOLIC PANEL
Anion gap: 3 — ABNORMAL LOW (ref 5–15)
BUN: 10 mg/dL (ref 6–20)
CALCIUM: 9 mg/dL (ref 8.9–10.3)
CO2: 31 mmol/L (ref 22–32)
CREATININE: 0.67 mg/dL (ref 0.44–1.00)
Chloride: 100 mmol/L — ABNORMAL LOW (ref 101–111)
GFR calc non Af Amer: >60 mL/min (ref >60)
Glucose, Bld: 160 mg/dL — ABNORMAL HIGH (ref 65–99)
Potassium: 4.6 mmol/L (ref 3.5–5.1)
SODIUM: 134 mmol/L — AB (ref 135–145)

## 2015-11-26 LAB — SURGICAL PATHOLOGY

## 2015-11-26 NOTE — Progress Notes (Signed)
1 Day Post-Op Subjective: The patient is doing well.  No nausea or vomiting. Pain is adequately controlled.  Objective: Vital signs in last 24 hours: Temp:  [97.6 F (36.4 C)-99.9 F (37.7 C)] 98.4 F (36.9 C) (09/19 0750) Pulse Rate:  [63-94] 83 (09/19 0750) Resp:  [11-25] 20 (09/19 0750) BP: (102-158)/(56-89) 126/69 (09/19 0750) SpO2:  [91 %-100 %] 97 % (09/19 0750)  Intake/Output  Intake/Output Summary (Last 24 hours) at 11/26/15 0813 Last data filed at 11/26/15 0545  Gross per 24 hour  Intake          2438.75 ml  Output             3125 ml  Net          -686.25 ml    Physical Exam:  General: Alert and oriented. CV: RRR Lungs: Clear bilaterally. GI: Soft, Nondistended. Incisions: Clean and dry. Urine: Clear, Foley in place Extremities: Nontender, no erythema, no edema.  Lab Results:  Recent Labs  11/26/15 0520  HGB 11.4*  HCT 33.6*  WBC 21.8*  PLT 307                 Results for orders placed or performed during the hospital encounter of 11/25/15 (from the past 24 hour(s))  Basic metabolic panel     Status: Abnormal   Collection Time: 11/26/15  5:20 AM  Result Value Ref Range   Sodium 134 (L) 135 - 145 mmol/L   Potassium 4.6 3.5 - 5.1 mmol/L   Chloride 100 (L) 101 - 111 mmol/L   CO2 31 22 - 32 mmol/L   Glucose, Bld 160 (H) 65 - 99 mg/dL   BUN 10 6 - 20 mg/dL   Creatinine, Ser 1.610.67 0.44 - 1.00 mg/dL   Calcium 9.0 8.9 - 09.610.3 mg/dL   GFR calc non Af Amer >60 >60 mL/min   GFR calc Af Amer >60 >60 mL/min   Anion gap 3 (L) 5 - 15  CBC     Status: Abnormal   Collection Time: 11/26/15  5:20 AM  Result Value Ref Range   WBC 21.8 (H) 3.6 - 11.0 K/uL   RBC 3.82 3.80 - 5.20 MIL/uL   Hemoglobin 11.4 (L) 12.0 - 16.0 g/dL   HCT 04.533.6 (L) 40.935.0 - 81.147.0 %   MCV 87.9 80.0 - 100.0 fL   MCH 29.8 26.0 - 34.0 pg   MCHC 33.9 32.0 - 36.0 g/dL   RDW 91.414.0 78.211.5 - 95.614.5 %   Platelets 307 150 - 440 K/uL    Assessment/Plan: POD# 1 s/p TAH/BS, LSO.  - Leukocytosis:  Afebrile. Repeat WBC in am. Physical exam benign - Decreased urine output: s/p 500ml bolus overnight with improvement. BUN Cr normal. - Elevated BP without tachycardia - resolved. Not on beta blockers - Hyperglycemia  1) Ambulate, Incentive spirometry 2) Advance diet as tolerated 3) Transition to oral pain medication with IV rescue from PCA dilaudid 4) D/C Foley  Christeen DouglasBethany Azelea Seguin, MD   LOS: 1 day   Christeen DouglasBEASLEY, Antonae Zbikowski 11/26/2015, 8:13 AM

## 2015-11-27 LAB — CBC WITH DIFFERENTIAL/PLATELET
BASOS PCT: 0 %
Basophils Absolute: 0 10*3/uL (ref 0–0.1)
Eosinophils Absolute: 0.2 10*3/uL (ref 0–0.7)
Eosinophils Relative: 1 %
HEMATOCRIT: 32.4 % — AB (ref 35.0–47.0)
HEMOGLOBIN: 10.8 g/dL — AB (ref 12.0–16.0)
Lymphocytes Relative: 20 %
Lymphs Abs: 3.2 10*3/uL (ref 1.0–3.6)
MCH: 29.5 pg (ref 26.0–34.0)
MCHC: 33.2 g/dL (ref 32.0–36.0)
MCV: 88.6 fL (ref 80.0–100.0)
MONOS PCT: 8 %
Monocytes Absolute: 1.3 10*3/uL — ABNORMAL HIGH (ref 0.2–0.9)
NEUTROS ABS: 11.2 10*3/uL — AB (ref 1.4–6.5)
NEUTROS PCT: 71 %
Platelets: 297 10*3/uL (ref 150–440)
RBC: 3.66 MIL/uL — ABNORMAL LOW (ref 3.80–5.20)
RDW: 13.9 % (ref 11.5–14.5)
WBC: 16 10*3/uL — ABNORMAL HIGH (ref 3.6–11.0)

## 2015-11-27 LAB — URINALYSIS COMPLETE WITH MICROSCOPIC (ARMC ONLY)
BACTERIA UA: NONE SEEN
Bilirubin Urine: NEGATIVE
GLUCOSE, UA: NEGATIVE mg/dL
Ketones, ur: NEGATIVE mg/dL
LEUKOCYTES UA: NEGATIVE
NITRITE: NEGATIVE
PH: 8 (ref 5.0–8.0)
Protein, ur: NEGATIVE mg/dL
Specific Gravity, Urine: 1.01 (ref 1.005–1.030)

## 2015-11-27 MED ORDER — SIMETHICONE 80 MG PO CHEW
80.0000 mg | CHEWABLE_TABLET | Freq: Four times a day (QID) | ORAL | Status: DC
  Administered 2015-11-27 – 2015-11-28 (×4): 80 mg via ORAL
  Filled 2015-11-27 (×5): qty 1

## 2015-11-27 NOTE — Progress Notes (Signed)
Patient ID: Heather Francis, female   DOB: 04/01/1970, 45 y.o.   MRN: [id number] UA: +blood (s/p cytoscopy), neg for nitrites, LE, bacteria Repeat CBC at 5pm

## 2015-11-27 NOTE — Progress Notes (Signed)
2 Days Post-Op Procedure(s) (LRB): EXAM UNDER ANESTHESIA (N/A) HYSTERECTOMY TOTAL LAPAROSCOPIC (N/A) CYSTOSCOPY (N/A) HYSTERECTOMY ABDOMINAL WITH SALPINGECTOMY: LEFT OOPHERECTOMY (N/A)  Subjective: Patient reports pain controlled, tolerating diet, +am,bulation, feeling well.  Objective: I have reviewed patient's vital signs, intake and output, medications and labs.  General: alert, cooperative and appears stated age Resp: clear to auscultation bilaterally and except for crackles in right lower lobe Cardio: regular rate and rhythm, S1, S2 normal, no murmur, click, rub or gallop GI: soft, non-tender; bowel sounds normal; no masses,  no organomegaly and incision: clean, dry and intact Extremities: extremities normal, atraumatic, no cyanosis or edema, no evidence DVT  Recent Results (from the past 2160 hour(s))  Basic metabolic panel     Status: Abnormal   Collection Time: 11/20/15  9:04 AM  Result Value Ref Range   Sodium 140 135 - 145 mmol/L   Potassium 3.9 3.5 - 5.1 mmol/L   Chloride 105 101 - 111 mmol/L   CO2 27 22 - 32 mmol/L   Glucose, Bld 186 (H) 65 - 99 mg/dL   BUN 12 6 - 20 mg/dL   Creatinine, Ser 0.75 0.44 - 1.00 mg/dL   Calcium 9.4 8.9 - 10.3 mg/dL   GFR calc non Af Amer >60 >60 mL/min   GFR calc Af Amer >60 >60 mL/min    Comment: (NOTE) The eGFR has been calculated using the CKD EPI equation. This calculation has not been validated in all clinical situations. eGFR's persistently <60 mL/min signify possible Chronic Kidney Disease.    Anion gap 8 5 - 15  CBC     Status: Abnormal   Collection Time: 11/20/15  9:04 AM  Result Value Ref Range   WBC 12.2 (H) 3.6 - 11.0 K/uL   RBC 4.64 3.80 - 5.20 MIL/uL   Hemoglobin 13.8 12.0 - 16.0 g/dL   HCT 41.0 35.0 - 47.0 %   MCV 88.4 80.0 - 100.0 fL   MCH 29.7 26.0 - 34.0 pg   MCHC 33.6 32.0 - 36.0 g/dL   RDW 14.0 11.5 - 14.5 %   Platelets 290 150 - 440 K/uL  Type and screen [place]     Status: None    Collection Time: 11/20/15  9:04 AM  Result Value Ref Range   ABO/RH(D) A POS    Antibody Screen NEG    Sample Expiration 11/23/2015    Extend sample reason      PREGNANT WITHIN 3 MONTHS, UNABLE TO EXTEND TRANSFUSED IN PAST 3 MONTHS, UNABLE TO EXTEND  Pregnancy, urine POC     Status: None   Collection Time: 11/25/15  6:06 AM  Result Value Ref Range   Preg Test, Ur NEGATIVE NEGATIVE    Comment:        THE SENSITIVITY OF THIS METHODOLOGY IS >24 mIU/mL   Type and screen [facility]     Status: None   Collection Time: 11/25/15  6:33 AM  Result Value Ref Range   ABO/RH(D) A POS    Antibody Screen NEG    Sample Expiration 11/28/2015   Surgical pathology     Status: None   Collection Time: 11/25/15 10:01 AM  Result Value Ref Range   SURGICAL PATHOLOGY      Surgical Pathology CASE: [id number] PATIENT: Heather Francis Surgical Pathology Report     SPECIMEN SUBMITTED: A. Uterus, cervix, bilateral tubes and left ovary  CLINICAL HISTORY: None provided  PRE-OPERATIVE DIAGNOSIS: Pelvic pain AUB  POST-OPERATIVE DIAGNOSIS: Same as  pre-op     DIAGNOSIS: A. UTERUS WITH CERVIX, BILATERAL FALLOPIAN TUBES AND LEFT OVARY; HYSTERECTOMY WITH BILATERAL SALPINGECTOMY AND LEFT OOPHORECTOMY: - CERVIX WITHIN NORMAL LIMITS. - PROLIFERATIVE ENDOMETRIUM. - UTERINE SEROSAL ADHESIONS. - SIMPLE CYST, FOLLICLE CYST AND ENDOSALPINGOSIS OF LEFT OVARY. - BILATERAL FALLOPIAN TUBES WITHOUT PATHOLOGIC CHANGE.   GROSS DESCRIPTION:  A. The specimen is received in a formalin-filled container labeled with the patient's name and uterus, cervix, bilateral tubes and left ovary.  Adnexa: 1 unattached ovary with small fragment of fimbriated fallopian attached and a 2.0 x 1.0 x 0.6 cm attached tubular structure to the uterus Weight: uterus in  2 fragments, 86 grams (uterine body cannot be oriented from landmarks) Dimensions:      Fundus -5.0 x 4.6 x 3.6 cm      Cervix -3.3 x  3.2 cm Serosa: purple tan with multiple cystic areas and fibrous adhesions Cervix: smooth pink-tan Endocervix: trabecular pink-tan Endometrial cavity:      Dimensions -2.6 x 1.8 cm      Thickness -0.2 cm      Other findings -soft red Myometrium:     Thickness -2.3 cm     Other findings -trabecular pink-tan  Adnexa:            Fallopian tube attached to uterus           Measurements -2.0 x 1.0 x 0.6           Other findings -none noted      Left ovary            Weight -10 grams           Measurement -3.4 x 3.0 x 1.6 cm           Serosa -purple tan with focal fibrous adhesion           Cut surface -heterogeneous pink-tan with a 1.5 and 1.1 cm cyst      Left fallopian tube (attached to ovary)            Measurements -1.3 x 1.0 x 0.8 cm           Other findings -fimbriated Other comments: none noted  Block summary: 1-2-represe ntative cervix 3-4-representative endomyometrium 5-representative cross-section attached to uterus fallopian tube 6-7-representative left ovary 8-cross-sections and longitudinal fimbriated end left fallopian tube attached to left ovary  Final Diagnosis performed by Delorse Lek, MD.  Electronically signed 11/26/2015 12:45:56PM    The electronic signature indicates that the named Attending Pathologist has evaluated the specimen  Technical component performed at [facility], [address] [place], [place] [address] Lab: [phone] Dir: Darrick Penna. Evette Doffing, MD  Professional component performed at [facility], [facility], [place], [place], [place] [address] Lab: [phone] Dir: Dellia Nims. Rubinas, MD    Basic metabolic panel     Status: Abnormal   Collection Time: 11/26/15  5:20 AM  Result Value Ref Range   Sodium 134 (L) 135 - 145 mmol/L   Potassium 4.6 3.5 - 5.1 mmol/L   Chloride 100 (L) 101 - 111 mmol/L   CO2 31 22 - 32 mmol/L   Glucose, Bld 160 (H) 65 - 99 mg/dL   BUN 10 6 - 20 mg/dL   Creatinine, Ser 0.67 0.44 - 1.00  mg/dL   Calcium 9.0 8.9 - 10.3 mg/dL   GFR calc non Af Amer >60 >60 mL/min   GFR calc Af Amer >60 >60 mL/min    Comment: (NOTE) The eGFR has been  calculated using the CKD EPI equation. This calculation has not been validated in all clinical situations. eGFR's persistently <60 mL/min signify possible Chronic Kidney Disease.    Anion gap 3 (L) 5 - 15  CBC     Status: Abnormal   Collection Time: 11/26/15  5:20 AM  Result Value Ref Range   WBC 21.8 (H) 3.6 - 11.0 K/uL   RBC 3.82 3.80 - 5.20 MIL/uL   Hemoglobin 11.4 (L) 12.0 - 16.0 g/dL   HCT 33.6 (L) 35.0 - 47.0 %   MCV 87.9 80.0 - 100.0 fL   MCH 29.8 26.0 - 34.0 pg   MCHC 33.9 32.0 - 36.0 g/dL   RDW 14.0 11.5 - 14.5 %   Platelets 307 150 - 440 K/uL     Assessment: s/p Procedure(s) with comments: EXAM UNDER ANESTHESIA (N/A) HYSTERECTOMY TOTAL LAPAROSCOPIC (N/A) - attempted laprascopic approach however had to convert to an open TAH CYSTOSCOPY (N/A) HYSTERECTOMY ABDOMINAL WITH SALPINGECTOMY: LEFT OOPHERECTOMY (N/A): leukocytosis  Plan: Leukocytosis without evidence of infection: WBC 21 today, 12 yesterday and prior to surgery. Will get UCx now and repeat tomorrow. As no fever, will not give empiric abx. Continue normal postop care  LOS: 2 days    Heather Francis 11/27/2015, 8:30 AM

## 2015-11-28 LAB — URINE CULTURE: Special Requests: NORMAL

## 2015-11-28 MED ORDER — OXYCODONE-ACETAMINOPHEN 5-325 MG PO TABS: 1.0000 | ORAL_TABLET | ORAL | 0 refills | Status: DC | PRN

## 2015-11-28 MED ORDER — METOPROLOL SUCCINATE ER 25 MG PO TB24: 25.0000 mg | ORAL_TABLET | Freq: Every day | ORAL | 0 refills | Status: AC

## 2015-11-28 MED ORDER — INFLUENZA VAC SPLIT QUAD 0.5 ML IM SUSY
0.5000 mL | PREFILLED_SYRINGE | Freq: Once | INTRAMUSCULAR | Status: AC
  Administered 2015-11-28: 0.5 mL via INTRAMUSCULAR
  Filled 2015-11-28: qty 0.5

## 2015-11-28 MED ORDER — METOPROLOL SUCCINATE ER 50 MG PO TB24
25.0000 mg | ORAL_TABLET | Freq: Every day | ORAL | Status: DC
  Administered 2015-11-28: 25 mg via ORAL
  Filled 2015-11-28: qty 1

## 2015-11-28 MED ORDER — DOCUSATE SODIUM 100 MG PO CAPS
100.0000 mg | ORAL_CAPSULE | Freq: Two times a day (BID) | ORAL | 0 refills | Status: DC
Start: 2015-11-28 — End: 2022-03-30

## 2015-11-28 MED ORDER — IBUPROFEN 600 MG PO TABS: 600.0000 mg | ORAL_TABLET | Freq: Four times a day (QID) | ORAL | 0 refills | Status: DC | PRN

## 2015-11-28 MED ORDER — ONDANSETRON 4 MG PO TBDP: 4.0000 mg | ORAL_TABLET | Freq: Four times a day (QID) | ORAL | 0 refills | Status: DC | PRN

## 2015-11-28 NOTE — Progress Notes (Signed)
Pt discharged at this time via wheelchair escorted by RN; pt going home; prescription given to pt; appointment for follow up was scheduled

## 2015-11-28 NOTE — Discharge Summary (Addendum)
Physician Discharge Summary  Patient ID: Heather Francis MRN: [id number] DOB/AGE: 44/04/1970 44 y.o.  Admit date: 11/25/2015 Discharge date: 11/28/2015  Admission Diagnoses:  Discharge Diagnoses:  Active Problems:   Abnormal uterine bleeding   Discharged Condition: stable  Hospital Course: POD#3 from TAH/BS, and left oopherectomy with cystoscopy for AUB and positional pelvic pain. Postop course complicated by: - accelerated hypertension: metoprolol XL started on day of discharge, with plan for repeat BP in 4 days in the office and patient to call PCP for visit next week. On phentermine at home. - leukocytosis: present prior to surgery. On day of discharge, repeat WBC with dropping count. Afebrile, no evidence of infection, physical exam benign  Consults: None  Significant Diagnostic Studies: labs: WBC to 21, down to 16, and 12 prior to surgery. Hgb postop 10.8  Discharge Exam: Blood pressure (!) 168/95, pulse 75, temperature 98 F (36.7 C), temperature source Oral, resp. rate 18, weight 163 lb (73.9 kg), last menstrual period 11/13/2015, SpO2 100 %. General appearance: alert, cooperative and appears stated age Head: Normocephalic, without obvious abnormality, atraumatic Resp: clear to auscultation bilaterally Cardio: regular rate and rhythm, S1, S2 normal, no murmur, click, rub or gallop GI: soft, non-tender; bowel sounds normal; no masses,  no organomegaly Extremities: extremities normal, atraumatic, no cyanosis or edema Pulses: 2+ and symmetric Skin: Skin color, texture, turgor normal. No rashes or lesions  Disposition: 01-Home or Self Care     Medication List    TAKE these medications   atorvastatin 40 MG tablet Commonly known as:  LIPITOR Take 40 mg by mouth daily at 6 PM.   docusate sodium 100 MG capsule Commonly known as:  COLACE Take 1 capsule (100 mg total) by mouth 2 (two) times daily.   ibuprofen 600 MG tablet Commonly known as:  ADVIL,MOTRIN Take 1 tablet  (600 mg total) by mouth every 6 (six) hours as needed (mild pain).   metoprolol succinate 25 MG 24 hr tablet Commonly known as:  TOPROL-XL Take 1 tablet (25 mg total) by mouth daily. Take with or immediately following a meal.   ondansetron 4 MG disintegrating tablet Commonly known as:  ZOFRAN ODT Take 1 tablet (4 mg total) by mouth every 6 (six) hours as needed for nausea.   oxyCODONE-acetaminophen 5-325 MG tablet Commonly known as:  PERCOCET/ROXICET Take 1-2 tablets by mouth every 4 (four) hours as needed (moderate to severe pain (when tolerating fluids)).   phentermine 37.5 MG capsule Take 37.5 mg by mouth every morning.        SignedChristeen Douglas: Rajvi Armentor 11/28/2015, 1:51 PM

## 2015-11-28 NOTE — Discharge Instructions (Signed)
Here is a helpful article from the website [employer], regarding constipation  Here are reasons why constipation occurs after surgery: 1) During the operation and in the recovery room, most people are given opioid pain medication, primarily through an IV, to treat moderate or severe pain. Intravenous opioids include morphine, Dilaudid and fentanyl. After surgery, patients are often prescribed opioid pain medication to take by mouth at home, including codeine, Vicodin, Norco, and Percocet. All of these medications cause constipation by slowing down the movement of your intestine. 2) Changes in your diet before surgery can be another culprit. It is common to get specific instructions to change how you normally eat or drink before your surgery, like only having liquids the day before or not having anything to eat or drink after midnight the night before surgery. For this reason, temporary dehydration may occur. This, along with not eating or only having liquids, means that you are getting less fiber than usual. Both these factors contribute to constipation. 3) Changes in your diet after surgery can also contribute to the problem. Although many people dont have dietary restrictions after operations, being under anesthesia can make you lose your appetite for several hours and maybe even days. Some people can even have nausea or vomiting. Not eating or drinking normally means that you are not getting enough fiber and you can get dehydrated, both leading to constipation. 4) Lying in a bed more than usual--which happens before, during and after surgery--combined with the medications and diet changes, all work together to slow down your colon and make your poop turn to rock.  No one likes to be constipated.  Lets face it, its not a pleasant feeling when you dont poop for days, then strain on the toilet to finally pass something large enough to cause damage. An ounce of prevention is worth a pound of cure,  so: 1. Assume you will be constipated. 2. Plan and prepare accordingly. Post-surgery is one of those unique situations where the temporary use of laxatives can make a world of difference. Always consult with your doctor, and recognize that if you wait several days after surgery to take a laxative, the constipation might be too severe for these over-the-counter options. It is always important to discuss all medications you plan on taking with your doctor. Ask your doctor if you can start the laxative immediately after surgery. *  Here are go-to post-surgery laxatives: Senna: Senna is an herb that acts as a stimulant laxative, meaning it increases the activity of the intestine to cause you to have a bowel movement. It comes in many forms, but senna pills are easy to take and are sold over the counter at almost all pharmacies. Since opioid pain medications slow down the activity of the intestine, it makes sense to take a medication to help reverse that side effect. Long-term use of a stimulant laxative is not a good idea since it can make your colon lazy and not function properly; however, temporary use immediately after surgery is acceptable. In general, if you are able to eat a normal diet, taking senna soon after surgery works the best. Senna usually works within hours to produce a bowel movement, but this is less predictable when you are taking different medications after surgery. Try not to wait several days to start taking senna, as often it is too late by then. Just like with all medications or supplements, check with your doctor before starting new treatment.   Magnesium: Magnesium is an important mineral that  our body needs. We get magnesium from some foods that we eat, especially foods that are high in fiber such as broccoli, almonds and whole grains. There are also magnesium-based medications used to treat constipation including milk of magnesia (magnesium hydroxide), magnesium citrate and  magnesium oxide. They work by drawing water into the intestine, putting it into the class of osmotic laxatives. Magnesium products in low doses appear to be safe, but if taken in very large doses, can lead to problems such as irregular heartbeat, low blood pressure and even death. It can also affect other medications you might be taking, therefore it is important to discuss using magnesium with your physician and pharmacist before initiating therapy. Most over-the-counter magnesium laxatives work very well to help with the constipation related to surgery, but sometimes they work too well and lead to diarrhea. Make sure you are somewhere with easy access to a bathroom, just in case.   Bisacodyl: Bisacodyl (generic name) is sold under brand names such as Dulcolax. Much like senna, it is a stimulant laxative, meaning it makes your intestines move more quickly to push out the stool. This is another good choice to start taking as soon as your doctor says you can take a laxative after surgery. It comes in pill form and as a suppository, which is a good choice for people who cannot or are not allowed to swallow pills. Studies have shown that it works as a laxative, but like most of these medications, you should use this on a short-term basis only.   Enema: Enemas strike fear in many people, but FEAR NOT! Its nowhere near as big a deal as you may think. An enema is just a way to get some liquid into your rectum by placing a specially designed device through your anus. If you have never done one, it might seem like a painful, unpleasant, uncomfortable, complicated and lengthy procedure. But in reality, its simple, takes just a few seconds and is highly effective. The small ready-made bottles you buy at the pharmacy are much easier than the hose/large rubber container type. Those recommended positions illustrated in some instructions are generally not necessary to place the enema. Its very similar to the insertion  of a tampon, requiring a slight squat. Some extra lubrication on the enemas tip (or on your anus) will make it a breeze. In certain cases, there is no substitute for a good enema. For example, if someone has not pooped for a few days, the beginning of the poop waiting to come out can become rock hard. Passing that hard stool can lead to much pain and problems like anal fissures. Inserting a little liquid to break up the rock-hard stool will help make its passage much easier. Enemas come with different liquids. Most come with saline, but there are also mineral oil options. You can also use warm water in the reusable enema containers. They all work. But since saline can sometimes be irritating, so try a mineral oil or water enema instead.  Here are commonly recommended constipation medications that do not work well for post-surgery constipation: Docusate: Docusate (generic name) most commonly referred to as Colace (brand name) is not really a laxative, but is classified as a stool softener. Although this medication is commonly prescribed, it is not recommended for several reasons: 1) there is no good medical evidence that it works 2) even if it has an effect, which is very questionable, it is minimal and cannot combat the intestinal slowing caused by the  opioid medications. Skip docusate to save money and space in your pillbox for something more effective.  PEG: Miralax (brand name) is basically a chemical called polyethylene glycol (PEG) and it has gained tremendous popularity as a laxative. This product is an osmotic laxative meaning it works by pulling water into the stool, making it softer. This is very similar to the action of natural fiber in foods and supplements. Therefore, the effect seen by this medication is not immediate, causing a bowel movement in a day or more. Is this medication strong enough to battle the constipation related to having an operation? Maybe for some people not prone to  constipation. But for most people, other laxatives are better to prevent constipation after surgery.   Signs and Symptoms to Report Call our office at [phone] if you have any of the following.   Fever over 100.4 degrees or higher  Severe stomach pain not relieved with pain medications  Bright red bleeding thats heavier than a period that does not slow with rest  To go the bathroom a lot (frequency), you cant hold your urine (urgency), or it hurts when you empty your bladder (urinate)  Chest pain  Shortness of breath  Pain in the calves of your legs  Severe nausea and vomiting not relieved with anti-nausea medications  Signs of infection around your wounds, such as redness, hot to touch, swelling, green/yellow drainage (like pus), bad smelling discharge  Any concerns  What You Can Expect after Surgery  You may see some pink tinged, bloody fluid and bruising around the wound. This is normal.  You may have a sore throat because of the tube in your mouth during general anesthesia. This will go away in 2 to 3 days.  You may have some stomach cramps.  You may notice spotting on your panties.  You may have pain around the incision sites.   Activities after Your Discharge Follow these guidelines to help speed your recovery at home:   Do the coughing and deep breathing as you did in the hospital for 2 weeks. Use the small blue breathing device, called the incentive spirometer for 2 weeks.  Dont drive if you are in pain or taking narcotic pain medicine. You may drive when you can safely slam on the brakes, turn the wheel forcefully, and rotate your torso comfortably. This is typically 1-2 weeks. Practice in a parking lot or side street prior to attempting to drive regularly.   Ask others to help with household chores for 4 weeks.  Do not lift anything heavier that 10 pounds for 4-6 weeks. This includes pets, children, and groceries.  Dont do strenuous activities,  exercises, or sports like vacuuming, tennis, squash, etc. until your doctor says it is safe to do so. ---If you had a hysterectomy (abdominal, laparoscopic, or vaginal) do not have intercourse for 8-10 weeks.   Walk as you feel able. Rest often since it may take two or three weeks for your energy level to return to normal.   You may climb stairs  Avoid constipation:   -Eat fruits, vegetables, and whole grains. Eat small meals as your appetite will take time to return to normal.   -Drink 6 to 8 glasses of water each day unless your doctor has told you to limit your fluids.   -Use a laxative or stool softener as needed if constipation becomes a problem. You may take Miralax, metamucil, Citrucil, Colace, Senekot, FiberCon, etc. If this does not relieve the constipation, try  two tablespoons of Milk Of Magnesia every 8 hours until your bowels move.   You may shower. Gently wash the wounds with a mild soap and water. Pat dry.  Do not get in a hot tub, swimming pool, etc. until your doctor agrees.  Do not use lotions, oils, powders on the wounds.  Do not douche, use tampons, or have sex until your doctor says it is okay.  Take your pain medicine when you need it. The medicine may not work as well if the pain is bad.  Take the medicines you were taking before surgery. Other medications you will need are pain medications (Norco or Percocet) and nausea medications (Zofran).   Call your doctor for increased pain or vaginal bleeding, temperature above 100.4, depression, or concerns.  No strenuous activity or heavy lifting for 6 weeks.  No intercourse, tampons, douching, or enemas for 6 weeks.  No tub baths-showers only.  No driving for 2 weeks or while taking pain medications.  Continue prenatal vitamin and iron.  Keep incision clean and dry.  Call your doctor for incision concerns including redness, swelling, bleeding or drainage, or if begins to come apart.

## 2015-12-13 IMAGING — US US PELVIS COMPLETE
1 series · 13 of 25 positions shown · non-contrast
Comparison: Report of an abdominal CT scan dated December 05, 2003.

CLINICAL DATA: Two months of abnormal uterine bleeding, history of
previous Cesarean sections, last menstrual period September 21, 2014

EXAM:
TRANSABDOMINAL AND TRANSVAGINAL ULTRASOUND OF PELVIS
TECHNIQUE: Both transabdominal and transvaginal ultrasound examinations of the
pelvis were performed. Transabdominal technique was performed for
global imaging of the pelvis including uterus, ovaries, adnexal
regions, and pelvic cul-de-sac. It was necessary to proceed with
endovaginal exam following the transabdominal exam to visualize the
uterus, endometrium, ovaries, and adnexal structures.

[Series 1: us pelvis complete · 0.25mm/px · 13 of 102 slices shown]
[im 1/102]
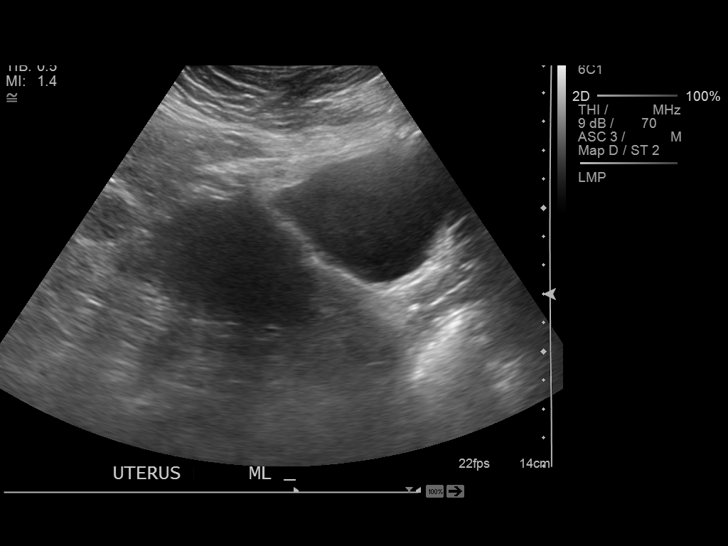
[im 9/102]
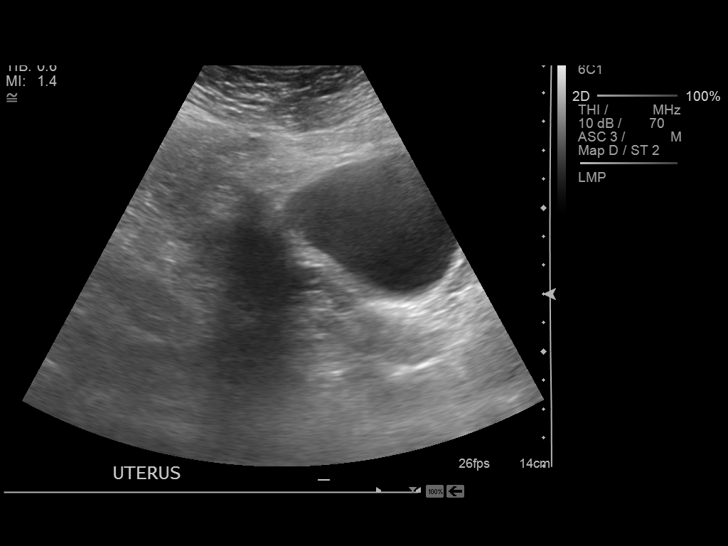
[im 17/102]
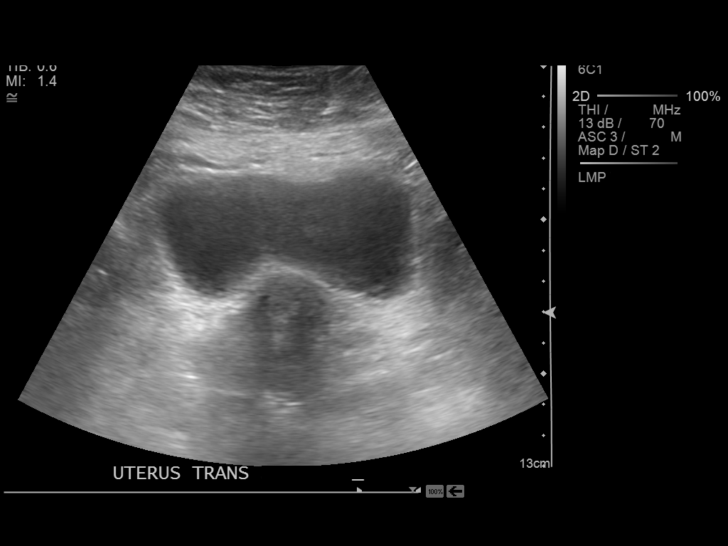
[im 26/102]
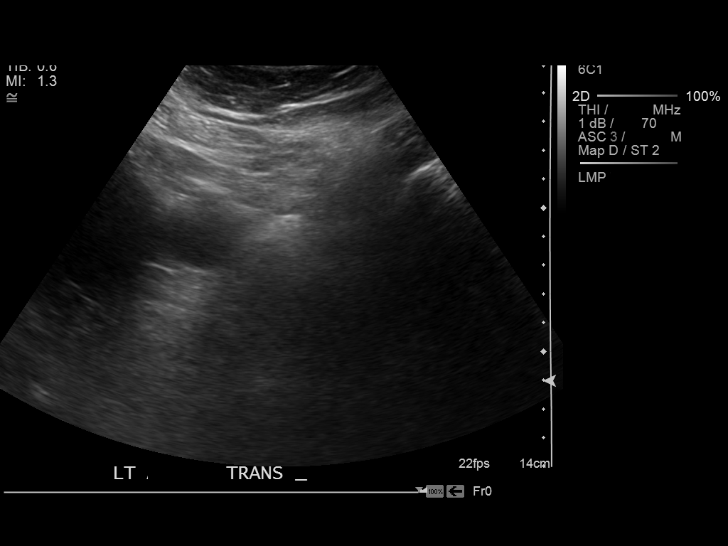
[im 34/102]
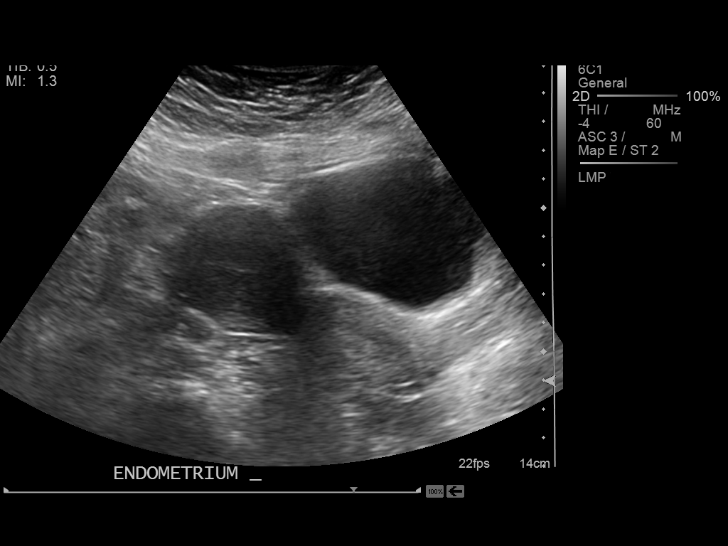
[im 43/102]
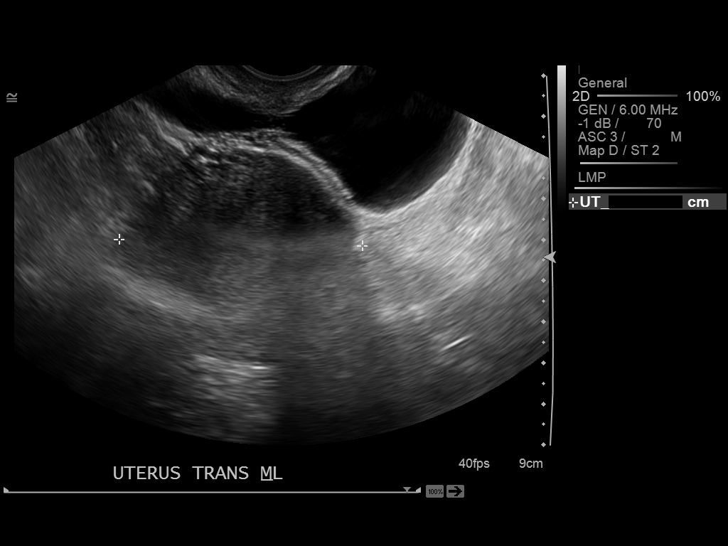
[im 51/102]
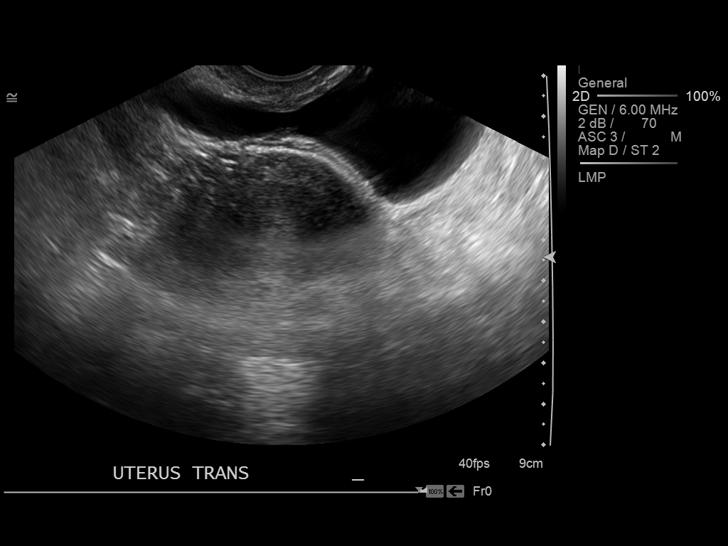
[im 59/102]
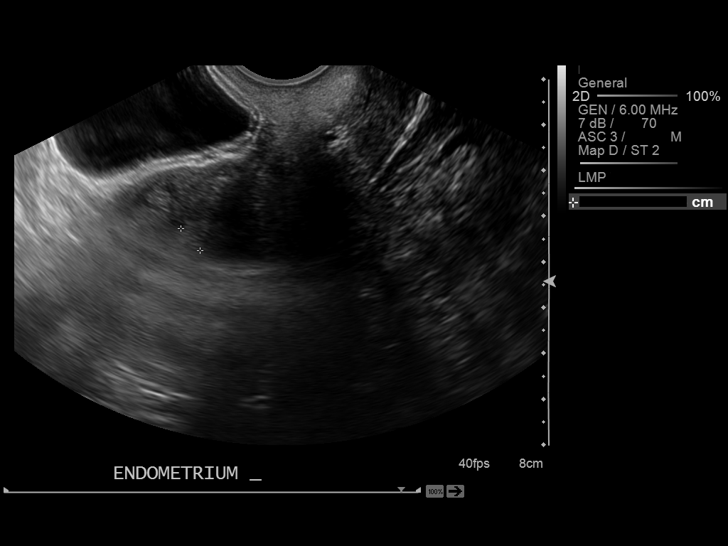
[im 68/102]
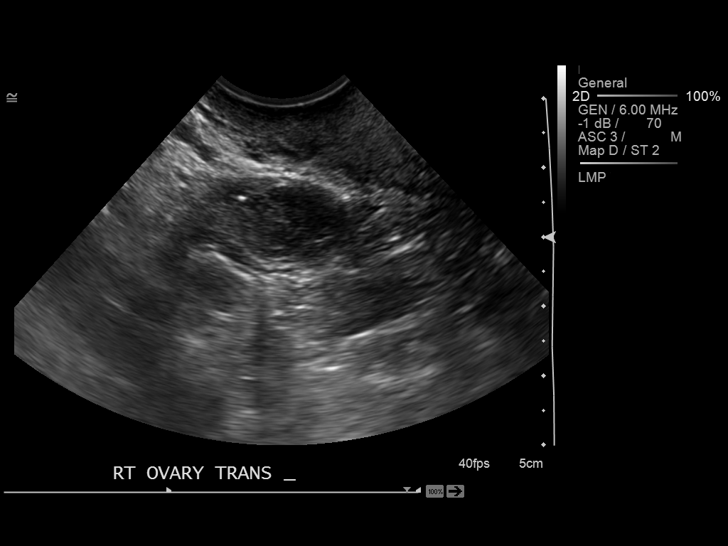
[im 76/102]
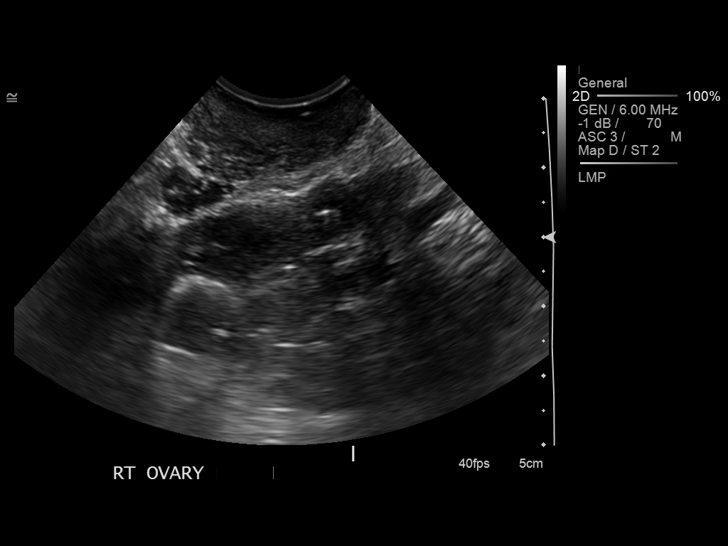
[im 85/102]
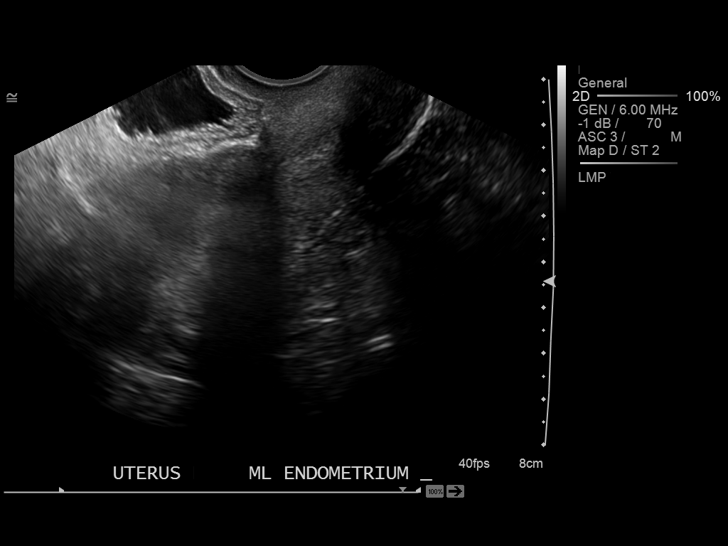
[im 93/102]
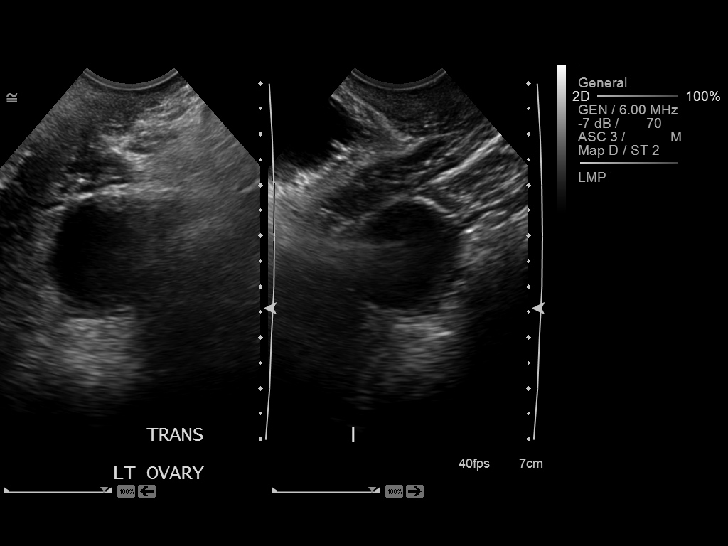
[im 102/102]
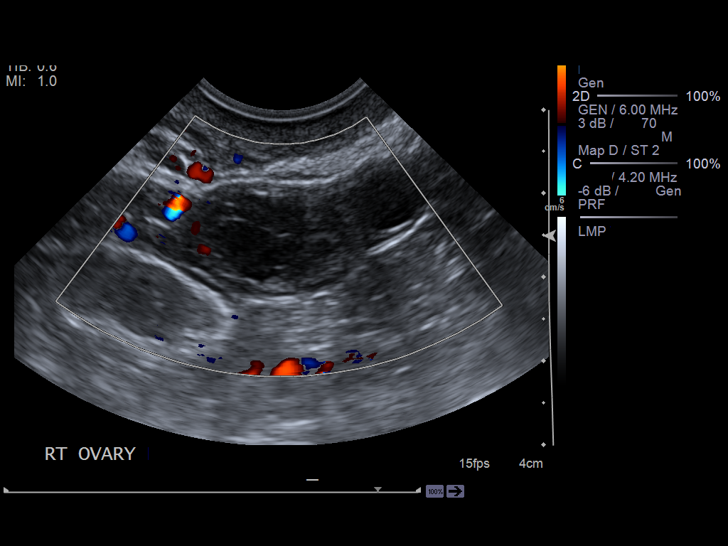

[13 of 25 positions shown; findings below may reference images not displayed]

FINDINGS: Uterus

Measurements: 9.9 x 4.6 x 5.9 cm. Evaluation of the myometrium was
limited due to scarring from the previous Cesarean sections. There
is a small amount of fluid in the cervix.

Endometrium

Thickness: 10 mm.  Visualization was limited.

Right ovary

Measurements: 2.9 x 1.2 x 2.2 cm. In the right ovary there is a
complex appearing hypoechoic region measuring 1.9 x 1.3 x 1.8 cm.
Definite cystic components are not observed.

Left ovary

Measurements: 2.5 x 2.8 x 2.1 cm. There is a cyst versus dominant
follicle measuring 1.7 x 1.9 x 2.1 cm.

Other findings

No free fluid.
IMPRESSION: 1. The uterus exhibits reasonably normal echotexture given the
limitations imposed on the scan by the C-section scars. The
endometrial stripe is 10 mm. There is fluid in the endocervical
canal. If bleeding remains unresponsive to hormonal or medical
therapy, sonohysterogram should be considered for focal lesion
work-up. (Ref: Radiological Reasoning: Algorithmic Workup of
Abnormal Vaginal Bleeding with Endovaginal Sonography and
Sonohysterography. AJR 4661; 191:S68-73)
2. Hypoechoic focus in the right ovary which may reflect a
hemorrhagic cyst or involuting follicle measuring 1.9 cm in greatest
dimension. A follow-up ultrasound in 6-12 weeks is recommended.
3. Dominant follicle versus small cyst in the left ovary measuring
2.1 cm in greatest dimension. This too could be re-evaluated on the
6-12 week follow-up ultrasound.

## 2017-05-25 ENCOUNTER — Ambulatory Visit: Payer: Managed Care, Other (non HMO) | Admitting: *Deleted

## 2017-10-29 ENCOUNTER — Other Ambulatory Visit: Payer: Self-pay | Admitting: Family Medicine

## 2017-10-29 DIAGNOSIS — R102 Pelvic and perineal pain: Secondary | ICD-10-CM

## 2017-11-12 ENCOUNTER — Ambulatory Visit: Admission: RE | Admit: 2017-11-12 | Payer: Commercial Managed Care - PPO | Source: Ambulatory Visit

## 2020-08-26 ENCOUNTER — Other Ambulatory Visit: Payer: Self-pay | Admitting: Family Medicine

## 2020-08-26 DIAGNOSIS — Z1231 Encounter for screening mammogram for malignant neoplasm of breast: Secondary | ICD-10-CM

## 2022-03-30 ENCOUNTER — Ambulatory Visit
Admission: EM | Admit: 2022-03-30 | Discharge: 2022-03-30 | Disposition: A | Discharge status: Home / Self Care | Payer: Commercial Managed Care - PPO | Attending: Emergency Medicine | Admitting: Emergency Medicine

## 2022-03-30 ENCOUNTER — Other Ambulatory Visit: Payer: Self-pay

## 2022-03-30 DIAGNOSIS — B9689 Other specified bacterial agents as the cause of diseases classified elsewhere: Secondary | ICD-10-CM | POA: Diagnosis not present

## 2022-03-30 DIAGNOSIS — N760 Acute vaginitis: Secondary | ICD-10-CM | POA: Diagnosis not present

## 2022-03-30 LAB — WET PREP, GENITAL
Sperm: NONE SEEN
Trich, Wet Prep: NONE SEEN
WBC, Wet Prep HPF POC: >10 — AB (ref <10)
Yeast Wet Prep HPF POC: NONE SEEN

## 2022-03-30 MED ORDER — METRONIDAZOLE 500 MG PO TABS: 500.0000 mg | ORAL_TABLET | Freq: Two times a day (BID) | ORAL | 0 refills | Status: AC

## 2022-03-30 NOTE — Discharge Instructions (Signed)

## 2022-03-30 NOTE — ED Triage Notes (Signed)
White vaginal discharge with itching x Friday

## 2022-03-30 NOTE — ED Provider Notes (Signed)
[facility]    CSN: [id number] Arrival date & time: 03/30/22  0803      History   Chief Complaint Chief Complaint  Patient presents with   Vaginal Discharge    HPI Heather Francis is a 52 y.o. female.   HPI  52 year old female here for evaluation of GYN complaint.  The patient reports that for the last 3 days she has been experiencing vaginal itching with a white vaginal discharge that does not have an odor.  She is a diabetic without well-controlled blood sugars who states that she is not following her diabetic diet and is also currently taking Jardiance and Trulicity.  She states that her blood sugar this morning was in the 230s.  She does have a history of frequent yeast infections.  This is not associated with any fever, abdominal pain, low back pain, nausea or vomiting.  Past Medical History:  Diagnosis Date   Depression    GERD (gastroesophageal reflux disease)    occ-no meds   Hyperlipidemia     Patient Active Problem List   Diagnosis Date Noted   Abnormal uterine bleeding 11/25/2015    Past Surgical History:  Procedure Laterality Date   CESAREAN SECTION     x2   CHOLECYSTECTOMY     CYSTOSCOPY N/A 11/25/2015   Procedure: CYSTOSCOPY;  Surgeon: Benjaman Kindler, MD;  Location: [facility];  Service: Gynecology;  Laterality: N/A;   HYSTERECTOMY ABDOMINAL WITH SALPINGECTOMY N/A 11/25/2015   Procedure: HYSTERECTOMY ABDOMINAL WITH SALPINGECTOMY: LEFT OOPHERECTOMY;  Surgeon: Benjaman Kindler, MD;  Location: [facility];  Service: Gynecology;  Laterality: N/A;   LAPAROSCOPIC HYSTERECTOMY N/A 11/25/2015   Procedure: HYSTERECTOMY TOTAL LAPAROSCOPIC;  Surgeon: Benjaman Kindler, MD;  Location: [facility];  Service: Gynecology;  Laterality: N/A;  attempted laprascopic approach however had to convert to an open TAH    OB History   No obstetric history on file.      Home Medications    Prior to Admission medications   Medication Sig Start Date End Date Taking?  Authorizing Provider  buPROPion (WELLBUTRIN XL) 150 MG 24 hr tablet Take by mouth. 01/01/22  Yes [provider]  metroNIDAZOLE (FLAGYL) 500 MG tablet Take 1 tablet (500 mg total) by mouth 2 (two) times daily. 03/30/22  Yes Margarette Canada, NP  TRULICITY 3 WU/9.8JX SOPN SMARTSIG:0.5 Milliliter(s) SUB-Q Once a Week 10/19/21  Yes [provider]  atorvastatin (LIPITOR) 40 MG tablet Take 40 mg by mouth daily at 6 PM.     [provider]  JARDIANCE 25 MG TABS tablet Take 25 mg by mouth daily.    [provider]  metoprolol succinate (TOPROL-XL) 25 MG 24 hr tablet Take 1 tablet (25 mg total) by mouth daily. Take with or immediately following a meal. 11/28/15   Benjaman Kindler, MD    Family History History reviewed. No pertinent family history.  Social History Social History   Tobacco Use   Smoking status: Every Day    Packs/day: 1.00    Years: 15.00    Total pack years: 15.00    Types: Cigarettes   Smokeless tobacco: Never  Vaping Use   Vaping Use: Never used  Substance Use Topics   Alcohol use: No   Drug use: No     Allergies   Dapagliflozin, Duloxetine, and Metformin   Review of Systems Review of Systems  Constitutional:  Negative for fever.  Gastrointestinal:  Negative for abdominal pain, nausea and vomiting.  Genitourinary:  Positive for vaginal  discharge and vaginal pain. Negative for dysuria, frequency and urgency.  Musculoskeletal:  Negative for back pain.  Hematological: Negative.   Psychiatric/Behavioral: Negative.       Physical Exam Triage Vital Signs ED Triage Vitals  Enc Vitals Group     BP      Pulse      Resp      Temp      Temp src      SpO2      Weight      Height      Head Circumference      Peak Flow      Pain Score      Pain Loc      Pain Edu?      Excl. in [place]?    No data found.  Updated Vital Signs BP 131/86 (BP Location: Left Arm)   Pulse 87   Temp 98.2 F (36.8 C) (Oral)   Resp 16   Ht 5' (1.524  m)   Wt 162 lb (73.5 kg)   LMP 11/13/2015 (Exact Date)   SpO2 96%   BMI 31.64 kg/m   Visual Acuity Right Eye Distance:   Left Eye Distance:   Bilateral Distance:    Right Eye Near:   Left Eye Near:    Bilateral Near:     Physical Exam Vitals and nursing note reviewed.  Constitutional:      Appearance: Normal appearance. She is not ill-appearing.  Cardiovascular:     Rate and Rhythm: Normal rate and regular rhythm.     Pulses: Normal pulses.     Heart sounds: Normal heart sounds. No murmur heard.    No friction rub. No gallop.  Pulmonary:     Effort: Pulmonary effort is normal.     Breath sounds: Normal breath sounds. No wheezing, rhonchi or rales.  Abdominal:     General: Abdomen is flat.     Palpations: Abdomen is soft.     Tenderness: There is no abdominal tenderness. There is no right CVA tenderness or left CVA tenderness.  Skin:    General: Skin is warm and dry.     Capillary Refill: Capillary refill takes less than 2 seconds.  Neurological:     General: No focal deficit present.     Mental Status: She is alert and oriented to person, place, and time.  Psychiatric:        Mood and Affect: Mood normal.        Behavior: Behavior normal.        Thought Content: Thought content normal.        Judgment: Judgment normal.      UC Treatments / Results  Labs (all labs ordered are listed, but only abnormal results are displayed) Labs Reviewed  WET PREP, GENITAL - Abnormal; Notable for the following components:      Result Value   Clue Cells Wet Prep HPF POC PRESENT (*)    WBC, Wet Prep HPF POC >10 (*)    All other components within normal limits    EKG   Radiology No results found.  Procedures Procedures (including critical care time)  Medications Ordered in UC Medications - No data to display  Initial Impression / Assessment and Plan / UC Course  I have reviewed the triage vital signs and the nursing notes.  Pertinent labs & imaging results that  were available during my care of the patient were reviewed by me and considered in my medical decision making (  see chart for details).   Patient is a nontoxic-appearing 52 year old female here for evaluation of a white vaginal discharge is causing vaginal itching x 3 days.  She has a frequent history of vaginal yeast infection secondary to uncontrolled diabetes.  The patient is taking Jardiance which puts her at increased risk for vaginal yeast infections as well as UTIs.  She is denying any urinary symptoms, low back pain, abdominal pain, nausea, or vomiting.  Her belly is soft and nontender and she has no CVA tenderness on exam.  I will order a vaginal wet prep to look for the presence of yeast vaginitis.  Vaginal wet prep is positive for clue cells but negative for yeast or trichomonas.  I will treat patient for bacterial vaginosis with metronidazole 500 mg twice daily for 7 days.   Final Clinical Impressions(s) / UC Diagnoses   Final diagnoses:  BV (bacterial vaginosis)     Discharge Instructions      Take the Flagyl (metronidazole) 500 mg twice daily for treatment of your bacterial vaginosis.  Avoid alcohol while on the metronidazole as taken together will cause of vomiting.  Bacterial vaginosis is often caused by a imbalance of bacteria in your vaginal vault.  This is sometimes a result of using tampons or hormonal fluctuations during her menstrual cycle.  You if your symptoms are recurrent you can try using a boric acid suppository twice weekly to help maintain the acid-base balance in your vagina vault which could prevent further infection.  You can also try vaginal probiotics to help return normal bacterial balance.      ED Prescriptions     Medication Sig Dispense Auth. Provider   metroNIDAZOLE (FLAGYL) 500 MG tablet Take 1 tablet (500 mg total) by mouth 2 (two) times daily. 14 tablet Heather Augusta, NP      PDMP not reviewed this encounter.   Heather Augusta,  NP 03/30/22 [phone]

## 2022-04-30 ENCOUNTER — Encounter (INDEPENDENT_AMBULATORY_CARE_PROVIDER_SITE_OTHER): Payer: Self-pay | Admitting: Family Medicine
